# Patient Record
Sex: Male | Born: 1960
Health system: Southern US, Community
[De-identification: ages and names within clinical notes are randomized; demographics above are authoritative.]

## PROBLEM LIST (undated history)

## (undated) DIAGNOSIS — F32A Depression, unspecified: Secondary | ICD-10-CM

## (undated) DIAGNOSIS — R202 Paresthesia of skin: Secondary | ICD-10-CM

## (undated) DIAGNOSIS — F329 Major depressive disorder, single episode, unspecified: Secondary | ICD-10-CM

## (undated) DIAGNOSIS — R079 Chest pain, unspecified: Secondary | ICD-10-CM

## (undated) DIAGNOSIS — R200 Anesthesia of skin: Secondary | ICD-10-CM

## (undated) DIAGNOSIS — F4310 Post-traumatic stress disorder, unspecified: Secondary | ICD-10-CM

## (undated) DIAGNOSIS — M9979 Connective tissue and disc stenosis of intervertebral foramina of abdomen and other regions: Secondary | ICD-10-CM

## (undated) DIAGNOSIS — R001 Bradycardia, unspecified: Secondary | ICD-10-CM

## (undated) DIAGNOSIS — M1990 Unspecified osteoarthritis, unspecified site: Secondary | ICD-10-CM

## (undated) DIAGNOSIS — F419 Anxiety disorder, unspecified: Secondary | ICD-10-CM

## (undated) DIAGNOSIS — D649 Anemia, unspecified: Secondary | ICD-10-CM

## (undated) DIAGNOSIS — G629 Polyneuropathy, unspecified: Secondary | ICD-10-CM

## (undated) HISTORY — PX: WISDOM TOOTH EXTRACTION: SHX21

## (undated) HISTORY — DX: Anxiety disorder, unspecified: F41.9

## (undated) HISTORY — DX: Depression, unspecified: F32.A

## (undated) HISTORY — DX: Major depressive disorder, single episode, unspecified: F32.9

## (undated) HISTORY — PX: COLONOSCOPY: SHX5424

## (undated) HISTORY — PX: SPINE SURGERY: SHX786

---

## 1997-10-18 ENCOUNTER — Emergency Department (HOSPITAL_COMMUNITY): Admission: EM | Admit: 1997-10-18 | Discharge: 1997-10-18 | Payer: Self-pay | Admitting: Emergency Medicine

## 2000-07-27 ENCOUNTER — Encounter: Payer: Self-pay | Admitting: Emergency Medicine

## 2000-07-27 ENCOUNTER — Observation Stay (HOSPITAL_COMMUNITY): Admission: EM | Admit: 2000-07-27 | Discharge: 2000-07-28 | Payer: Self-pay | Admitting: Emergency Medicine

## 2001-05-31 ENCOUNTER — Encounter: Admission: RE | Admit: 2001-05-31 | Discharge: 2001-05-31 | Payer: Self-pay | Admitting: *Deleted

## 2001-06-01 ENCOUNTER — Encounter: Admission: RE | Admit: 2001-06-01 | Discharge: 2001-06-01 | Payer: Self-pay | Admitting: *Deleted

## 2003-02-01 ENCOUNTER — Ambulatory Visit (HOSPITAL_COMMUNITY): Admission: RE | Admit: 2003-02-01 | Discharge: 2003-02-01 | Payer: Self-pay | Admitting: Internal Medicine

## 2003-07-24 ENCOUNTER — Inpatient Hospital Stay (HOSPITAL_COMMUNITY): Admission: EM | Admit: 2003-07-24 | Discharge: 2003-07-25 | Payer: Self-pay | Admitting: Emergency Medicine

## 2003-11-29 ENCOUNTER — Other Ambulatory Visit (HOSPITAL_COMMUNITY): Admission: RE | Admit: 2003-11-29 | Discharge: 2004-02-27 | Payer: Self-pay | Admitting: Psychiatry

## 2003-11-29 ENCOUNTER — Ambulatory Visit: Payer: Self-pay | Admitting: Psychiatry

## 2003-12-14 ENCOUNTER — Ambulatory Visit (HOSPITAL_COMMUNITY): Payer: Self-pay | Admitting: Professional Counselor

## 2003-12-20 ENCOUNTER — Ambulatory Visit (HOSPITAL_COMMUNITY): Payer: Self-pay | Admitting: Professional Counselor

## 2004-09-25 ENCOUNTER — Ambulatory Visit: Payer: Self-pay | Admitting: Internal Medicine

## 2005-01-16 ENCOUNTER — Ambulatory Visit: Payer: Self-pay | Admitting: Family Medicine

## 2005-02-25 ENCOUNTER — Ambulatory Visit: Payer: Self-pay | Admitting: Internal Medicine

## 2005-04-28 ENCOUNTER — Ambulatory Visit: Payer: Self-pay | Admitting: Internal Medicine

## 2005-08-01 ENCOUNTER — Ambulatory Visit: Payer: Self-pay | Admitting: Internal Medicine

## 2005-09-29 ENCOUNTER — Ambulatory Visit: Payer: Self-pay | Admitting: Internal Medicine

## 2005-10-01 ENCOUNTER — Ambulatory Visit: Payer: Self-pay | Admitting: Internal Medicine

## 2006-03-31 ENCOUNTER — Ambulatory Visit: Payer: Self-pay | Admitting: Internal Medicine

## 2006-05-28 ENCOUNTER — Ambulatory Visit: Payer: Self-pay | Admitting: Family Medicine

## 2006-05-28 LAB — CONVERTED CEMR LAB
Basophils Relative: 0.2 % (ref 0.0–1.0)
Calcium: 9.5 mg/dL (ref 8.4–10.5)
Chloride: 103 meq/L (ref 96–112)
Eosinophils Relative: 4 % (ref 0.0–5.0)
GFR calc Af Amer: 94 mL/min
Glucose, Bld: 99 mg/dL (ref 70–99)
HCT: 45 % (ref 39.0–52.0)
Lipase: 15 units/L (ref 11.0–59.0)
Monocytes Relative: 9 % (ref 3.0–11.0)
Neutro Abs: 3 10*3/uL (ref 1.4–7.7)
Neutrophils Relative %: 62.5 % (ref 43.0–77.0)
Potassium: 4.7 meq/L (ref 3.5–5.1)
RBC: 4.29 M/uL (ref 4.22–5.81)
RDW: 12.5 % (ref 11.5–14.6)
WBC: 4.7 10*3/uL (ref 4.5–10.5)

## 2006-05-29 ENCOUNTER — Ambulatory Visit: Payer: Self-pay | Admitting: Gastroenterology

## 2006-12-31 ENCOUNTER — Ambulatory Visit: Payer: Self-pay | Admitting: Internal Medicine

## 2006-12-31 ENCOUNTER — Encounter (INDEPENDENT_AMBULATORY_CARE_PROVIDER_SITE_OTHER): Payer: Self-pay | Admitting: *Deleted

## 2006-12-31 DIAGNOSIS — F3289 Other specified depressive episodes: Secondary | ICD-10-CM | POA: Insufficient documentation

## 2006-12-31 DIAGNOSIS — F329 Major depressive disorder, single episode, unspecified: Secondary | ICD-10-CM

## 2007-01-08 ENCOUNTER — Telehealth (INDEPENDENT_AMBULATORY_CARE_PROVIDER_SITE_OTHER): Payer: Self-pay | Admitting: *Deleted

## 2007-01-26 ENCOUNTER — Ambulatory Visit: Payer: Self-pay | Admitting: Internal Medicine

## 2007-01-26 DIAGNOSIS — F411 Generalized anxiety disorder: Secondary | ICD-10-CM

## 2007-03-16 ENCOUNTER — Telehealth: Payer: Self-pay | Admitting: Internal Medicine

## 2007-06-07 ENCOUNTER — Telehealth (INDEPENDENT_AMBULATORY_CARE_PROVIDER_SITE_OTHER): Payer: Self-pay | Admitting: *Deleted

## 2007-07-23 ENCOUNTER — Telehealth (INDEPENDENT_AMBULATORY_CARE_PROVIDER_SITE_OTHER): Payer: Self-pay | Admitting: *Deleted

## 2007-07-27 ENCOUNTER — Telehealth (INDEPENDENT_AMBULATORY_CARE_PROVIDER_SITE_OTHER): Payer: Self-pay | Admitting: *Deleted

## 2007-07-29 ENCOUNTER — Ambulatory Visit: Payer: Self-pay | Admitting: Internal Medicine

## 2007-09-20 ENCOUNTER — Telehealth (INDEPENDENT_AMBULATORY_CARE_PROVIDER_SITE_OTHER): Payer: Self-pay | Admitting: *Deleted

## 2007-11-02 ENCOUNTER — Telehealth (INDEPENDENT_AMBULATORY_CARE_PROVIDER_SITE_OTHER): Payer: Self-pay | Admitting: *Deleted

## 2007-11-23 ENCOUNTER — Ambulatory Visit: Payer: Self-pay | Admitting: Internal Medicine

## 2008-01-03 ENCOUNTER — Telehealth (INDEPENDENT_AMBULATORY_CARE_PROVIDER_SITE_OTHER): Payer: Self-pay | Admitting: *Deleted

## 2008-04-14 ENCOUNTER — Ambulatory Visit: Payer: Self-pay | Admitting: Family Medicine

## 2008-04-14 LAB — CONVERTED CEMR LAB
Inflenza A Ag: NEGATIVE
Influenza B Ag: NEGATIVE

## 2008-06-20 ENCOUNTER — Telehealth (INDEPENDENT_AMBULATORY_CARE_PROVIDER_SITE_OTHER): Payer: Self-pay | Admitting: *Deleted

## 2008-06-29 ENCOUNTER — Encounter: Payer: Self-pay | Admitting: Internal Medicine

## 2008-06-29 ENCOUNTER — Ambulatory Visit: Payer: Self-pay | Admitting: Internal Medicine

## 2008-06-29 ENCOUNTER — Ambulatory Visit: Payer: Self-pay | Admitting: Surgery

## 2008-06-29 ENCOUNTER — Ambulatory Visit: Payer: Self-pay | Admitting: Diagnostic Radiology

## 2008-06-29 ENCOUNTER — Inpatient Hospital Stay (HOSPITAL_COMMUNITY): Admission: AD | Admit: 2008-06-29 | Discharge: 2008-06-30 | Payer: Self-pay | Admitting: Internal Medicine

## 2008-06-29 ENCOUNTER — Encounter: Payer: Self-pay | Admitting: Emergency Medicine

## 2008-06-29 DIAGNOSIS — F1010 Alcohol abuse, uncomplicated: Secondary | ICD-10-CM | POA: Insufficient documentation

## 2008-06-29 DIAGNOSIS — R55 Syncope and collapse: Secondary | ICD-10-CM

## 2008-06-30 ENCOUNTER — Encounter (INDEPENDENT_AMBULATORY_CARE_PROVIDER_SITE_OTHER): Payer: Self-pay | Admitting: Orthopedic Surgery

## 2009-01-29 ENCOUNTER — Telehealth (INDEPENDENT_AMBULATORY_CARE_PROVIDER_SITE_OTHER): Payer: Self-pay | Admitting: *Deleted

## 2009-01-30 ENCOUNTER — Telehealth: Payer: Self-pay | Admitting: Family Medicine

## 2009-01-30 ENCOUNTER — Encounter: Payer: Self-pay | Admitting: Internal Medicine

## 2009-01-30 ENCOUNTER — Ambulatory Visit: Payer: Self-pay | Admitting: Family Medicine

## 2009-01-30 DIAGNOSIS — R42 Dizziness and giddiness: Secondary | ICD-10-CM

## 2009-01-30 DIAGNOSIS — R079 Chest pain, unspecified: Secondary | ICD-10-CM | POA: Insufficient documentation

## 2009-01-30 LAB — CONVERTED CEMR LAB
ALT: 22 units/L (ref 0–53)
AST: 22 units/L (ref 0–37)
Alkaline Phosphatase: 53 units/L (ref 39–117)
Basophils Absolute: 0 10*3/uL (ref 0.0–0.1)
CK-MB: 0.9 ng/mL (ref 0.3–4.0)
CO2: 30 meq/L (ref 19–32)
Calcium: 9.7 mg/dL (ref 8.4–10.5)
Chloride: 100 meq/L (ref 96–112)
Creatinine, Ser: 1 mg/dL (ref 0.4–1.5)
Eosinophils Relative: 1.5 % (ref 0.0–5.0)
GFR calc non Af Amer: 85.39 mL/min (ref >60)
Glucose, Bld: 96 mg/dL (ref 70–99)
HCT: 44.4 % (ref 39.0–52.0)
Lymphocytes Relative: 27.4 % (ref 12.0–46.0)
Lymphs Abs: 1.5 10*3/uL (ref 0.7–4.0)
MCV: 102.1 fL — ABNORMAL HIGH (ref 78.0–100.0)
Monocytes Absolute: 0.4 10*3/uL (ref 0.1–1.0)
Neutro Abs: 3.3 10*3/uL (ref 1.4–7.7)
RBC: 4.35 M/uL (ref 4.22–5.81)
RDW: 12.9 % (ref 11.5–14.6)
Total Bilirubin: 0.9 mg/dL (ref 0.3–1.2)
WBC: 5.3 10*3/uL (ref 4.5–10.5)

## 2009-02-01 ENCOUNTER — Telehealth (INDEPENDENT_AMBULATORY_CARE_PROVIDER_SITE_OTHER): Payer: Self-pay | Admitting: *Deleted

## 2009-05-19 ENCOUNTER — Emergency Department (HOSPITAL_BASED_OUTPATIENT_CLINIC_OR_DEPARTMENT_OTHER): Admission: EM | Admit: 2009-05-19 | Discharge: 2009-05-19 | Payer: Self-pay | Admitting: Emergency Medicine

## 2009-05-29 ENCOUNTER — Ambulatory Visit: Payer: Self-pay | Admitting: Family Medicine

## 2009-05-29 DIAGNOSIS — R5381 Other malaise: Secondary | ICD-10-CM

## 2009-05-29 DIAGNOSIS — R5383 Other fatigue: Secondary | ICD-10-CM

## 2009-05-29 DIAGNOSIS — J029 Acute pharyngitis, unspecified: Secondary | ICD-10-CM | POA: Insufficient documentation

## 2009-05-30 ENCOUNTER — Encounter: Payer: Self-pay | Admitting: Family Medicine

## 2009-06-04 ENCOUNTER — Telehealth: Payer: Self-pay | Admitting: Family Medicine

## 2009-06-04 LAB — CONVERTED CEMR LAB
Albumin: 4.3 g/dL (ref 3.5–5.2)
Basophils Absolute: 0 10*3/uL (ref 0.0–0.1)
Basophils Relative: 0.1 % (ref 0.0–3.0)
CO2: 30 meq/L (ref 19–32)
Calcium: 9.7 mg/dL (ref 8.4–10.5)
Chloride: 107 meq/L (ref 96–112)
Creatinine, Ser: 1.1 mg/dL (ref 0.4–1.5)
EBV VCA IgM: 0.16
Eosinophils Absolute: 0.1 10*3/uL (ref 0.0–0.7)
Eosinophils Relative: 1.8 % (ref 0.0–5.0)
Folate: 10.2 ng/mL
Free T4: 0.8 ng/dL (ref 0.6–1.6)
Glucose, Bld: 97 mg/dL (ref 70–99)
HCT: 43.8 % (ref 39.0–52.0)
Lymphs Abs: 1.1 10*3/uL (ref 0.7–4.0)
MCV: 102 fL — ABNORMAL HIGH (ref 78.0–100.0)
Mono Screen: NEGATIVE
Monocytes Absolute: 0.3 10*3/uL (ref 0.1–1.0)
Neutrophils Relative %: 68 % (ref 43.0–77.0)
Vitamin B-12: 568 pg/mL (ref 211–911)
WBC: 4.6 10*3/uL (ref 4.5–10.5)

## 2009-06-08 ENCOUNTER — Encounter: Payer: Self-pay | Admitting: Family Medicine

## 2010-04-25 NOTE — Consult Note (Signed)
Summary: [facility]  [facility]   Imported By: Lanelle Bal 06/18/2009 10:38:09  _____________________________________________________________________  External Attachment:    Type:   Image     Comment:   External Document

## 2010-04-25 NOTE — Assessment & Plan Note (Signed)
Summary: PAIN IN THROAT/FATIGUE/KDC   Vital Signs:  Patient profile:   50 year old male Weight:      253 pounds Temp:     98.5 degrees F oral Pulse rate:   68 / minute Pulse rhythm:   regular BP sitting:   132 / 80  (left arm) Cuff size:   large  Vitals Entered By: Army Fossa CMA (May 29, 2009 9:49 AM) CC: Pt c/o sore throat x 4 weeks, very fatigued.  Was seen at the [facility], they did a strep and mono both came back negative.    History of Present Illness: Pt here c/o sore throat and fatigue for 4-5 weeks.  Pt was seen in ER and strep and mono then were negative.  Pt feels no better.  No fever,  no congestion.  Allergies (verified): No Known Drug Allergies  Social History: Married 1 adopted daughter 2 step sons All gone Household: pt. +wife+step son works in the same company with wife + chews tobacco since 80s   Review of Systems      See HPI  Physical Exam  General:  Well-developed,well-nourished,in no acute distress; alert,appropriate and cooperative throughout examination Ears:  External ear exam shows no significant lesions or deformities.  Otoscopic examination reveals clear canals, tympanic membranes are intact bilaterally without bulging, retraction, inflammation or discharge. Hearing is grossly normal bilaterally. Nose:  External nasal examination shows no deformity or inflammation. Nasal mucosa are pink and moist without lesions or exudates. Mouth:  no exudates and pharyngeal erythema.   Neck:  No deformities, masses, or tenderness noted. Lungs:  Normal respiratory effort, chest expands symmetrically. Lungs are clear to auscultation, no crackles or wheezes. Heart:  normal rate and no murmur.   Psych:  Oriented X3 and normally interactive.     Impression & Recommendations:  Problem # 1:  ACUTE PHARYNGITIS (ICD-462) If no better with abx and labs negative---refer to ENT---pt with long hx tob chewing His updated medication list for this problem  includes:    Ceftin 500 Mg Tabs (Cefuroxime axetil) .Marland Kitchen... 1 by mouth two times a day  Orders: T-Culture, Throat ([address]-60454) Venipuncture (09811) TLB-B12 + Folate Pnl (91478_29562-Z30/QMV) TLB-BMP (Basic Metabolic Panel-BMET) (80048-METABOL) TLB-CBC Platelet - w/Differential (85025-CBCD) TLB-Hepatic/Liver Function Pnl (80076-HEPATIC) TLB-TSH (Thyroid Stimulating Hormone) (84443-TSH) TLB-T4 (Thyrox), Free [phone]) TLB-T3, Free (Triiodothyronine) (84481-T3FREE) TLB-Mono Test (Monospot) (28413-KGMW) T- * Misc. Laboratory test [id number])  Instructed to complete antibiotics and call if not improved in 48 hours.   Problem # 2:  FATIGUE (ICD-780.79)  Orders: Venipuncture (53664) TLB-B12 + Folate Pnl (40347_42595-G38/VFI) TLB-BMP (Basic Metabolic Panel-BMET) (80048-METABOL) TLB-CBC Platelet - w/Differential (85025-CBCD) TLB-Hepatic/Liver Function Pnl (80076-HEPATIC) TLB-TSH (Thyroid Stimulating Hormone) (84443-TSH) TLB-T4 (Thyrox), Free [phone]) TLB-T3, Free (Triiodothyronine) (84481-T3FREE) TLB-Mono Test (Monospot) (41660-YTKZ) T- * Misc. Laboratory test [phone])  Complete Medication List: 1)  Neurontin 300 Mg Caps (Gabapentin) .Marland Kitchen.. 1 by mouth qid 2)  Ceftin 500 Mg Tabs (Cefuroxime axetil) .Marland Kitchen.. 1 by mouth two times a day 3)  Prednisone 10 Mg Tabs (Prednisone) .... 3 by mouth once daily for 3 days then 2 by mouth once daily for 3 days then 1 by mouth once daily for 3 days 4)  Ultram 50 Mg Tabs (Tramadol hcl) .Marland Kitchen.. 1- 2 by mouth every 6 hours as needed Prescriptions: ULTRAM 50 MG TABS (TRAMADOL HCL) 1- 2 by mouth every 6 hours as needed  #30 x 0   Entered and Authorized by:   Loreen Freud DO   Signed by:   Myrene Buddy  Lowne DO on 05/29/2009   Method used:   Electronically to        [facility] [phone]* (retail)       [address]       [place], [place]  [address]       Ph: [phone]       Fax: [phone]   RxID:   [id number] PREDNISONE 10  MG TABS (PREDNISONE) 3 by mouth once daily for 3 days then 2 by mouth once daily for 3 days then 1 by mouth once daily for 3 days  #18 x 0   Entered and Authorized by:   Loreen Freud DO   Signed by:   Loreen Freud DO on 05/29/2009   Method used:   Electronically to        [facility] [phone]* (retail)       [address]       [place], [place]  [address]       Ph: [phone]       Fax: [phone]   RxID:   [id number] CEFTIN 500 MG TABS (CEFUROXIME AXETIL) 1 by mouth two times a day  #20 x 0   Entered and Authorized by:   Loreen Freud DO   Signed by:   Loreen Freud DO on 05/29/2009   Method used:   Electronically to        [facility] [phone]* (retail)       [address]       [place], [place]  [address]       Ph: [phone]       Fax: [phone]   RxID:   [id number]

## 2010-04-25 NOTE — Progress Notes (Signed)
Summary: Refill  Phone Note Refill Request Message from:  Patient  Refills Requested: Medication #1:  ULTRAM 50 MG TABS 1- 2 by mouth every 6 hours as needed.   Last Refilled: 05/29/2009 Pt called and stated that he ran out of the Ultram, his throat is still bothering him and in a lot of pain. He states he is taking the ATB and Prednisone as prescribed. Army Fossa CMA  June 04, 2009 11:37 AM    Follow-up for Phone Call        refill ultram x1---refer to ent Follow-up by: Loreen Freud DO,  June 04, 2009 12:15 PM  Additional Follow-up for Phone Call Additional follow up Details #1::        Pt is aware. Army Fossa CMA  June 04, 2009 1:22 PM     Prescriptions: ULTRAM 50 MG TABS (TRAMADOL HCL) 1- 2 by mouth every 6 hours as needed  #30 x 0   Entered by:   Army Fossa CMA   Authorized by:   Loreen Freud DO   Signed by:   Army Fossa CMA on 06/04/2009   Method used:   Electronically to        [facility] [phone]* (retail)       [address]       [place], [place]  [address]       Ph: [phone]       Fax: [phone]   RxID:   [id number]

## 2010-06-03 ENCOUNTER — Emergency Department (INDEPENDENT_AMBULATORY_CARE_PROVIDER_SITE_OTHER): Payer: 59

## 2010-06-03 ENCOUNTER — Emergency Department (HOSPITAL_BASED_OUTPATIENT_CLINIC_OR_DEPARTMENT_OTHER)
Admission: EM | Admit: 2010-06-03 | Discharge: 2010-06-03 | Disposition: A | Discharge status: Home / Self Care | Payer: 59 | Attending: Emergency Medicine | Admitting: Emergency Medicine

## 2010-06-03 DIAGNOSIS — M25529 Pain in unspecified elbow: Secondary | ICD-10-CM

## 2010-06-03 DIAGNOSIS — M7710 Lateral epicondylitis, unspecified elbow: Secondary | ICD-10-CM | POA: Insufficient documentation

## 2010-06-12 LAB — MONONUCLEOSIS SCREEN: Mono Screen: NEGATIVE

## 2010-07-03 LAB — POCT TOXICOLOGY PANEL: Benzodiazepines: POSITIVE

## 2010-07-03 LAB — DIFFERENTIAL
Basophils Absolute: 0.1 10*3/uL (ref 0.0–0.1)
Basophils Relative: 2 % — ABNORMAL HIGH (ref 0–1)
Eosinophils Absolute: 0.2 10*3/uL (ref 0.0–0.7)
Lymphocytes Relative: 25 % (ref 12–46)
Lymphs Abs: 1.1 10*3/uL (ref 0.7–4.0)
Neutro Abs: 2.8 10*3/uL (ref 1.7–7.7)
Neutrophils Relative %: 61 % (ref 43–77)

## 2010-07-03 LAB — URINALYSIS, ROUTINE W REFLEX MICROSCOPIC
Nitrite: NEGATIVE
Protein, ur: NEGATIVE mg/dL

## 2010-07-03 LAB — COMPREHENSIVE METABOLIC PANEL
ALT: 17 U/L (ref 0–53)
Albumin: 4.4 g/dL (ref 3.5–5.2)
Chloride: 105 mEq/L (ref 96–112)
GFR calc Af Amer: >60 mL/min (ref >60)
GFR calc non Af Amer: >60 mL/min (ref >60)
Potassium: 4.8 mEq/L (ref 3.5–5.1)
Sodium: 141 mEq/L (ref 135–145)
Total Protein: 8 g/dL (ref 6.0–8.3)

## 2010-07-03 LAB — CBC
Hemoglobin: 16 g/dL (ref 13.0–17.0)
Platelets: 267 10*3/uL (ref 150–400)
WBC: 4.6 10*3/uL (ref 4.0–10.5)

## 2010-07-03 LAB — CARDIAC PANEL(CRET KIN+CKTOT+MB+TROPI)
CK, MB: 0.6 ng/mL (ref 0.3–4.0)
CK, MB: 0.6 ng/mL (ref 0.3–4.0)
Relative Index: INVALID (ref 0.0–2.5)
Relative Index: INVALID (ref 0.0–2.5)
Total CK: 50 U/L (ref 7–232)
Total CK: 52 U/L (ref 7–232)
Troponin I: <0.01 ng/mL (ref 0.00–0.06)
Troponin I: <0.01 ng/mL (ref 0.00–0.06)

## 2010-07-03 LAB — ETHANOL: Alcohol, Ethyl (B): <10 mg/dL (ref 0–10)

## 2010-07-03 LAB — POCT CARDIAC MARKERS
CKMB, poc: <1 ng/mL — ABNORMAL LOW (ref 1.0–8.0)
Myoglobin, poc: 90.8 ng/mL (ref 12–200)
Troponin i, poc: <0.05 ng/mL (ref 0.00–0.09)

## 2010-07-03 LAB — MAGNESIUM: Magnesium: 2.3 mg/dL (ref 1.5–2.5)

## 2010-08-06 NOTE — Discharge Summary (Signed)
Erik Olson, Erik Olson               ACCOUNT NO.:  [id number]   MEDICAL RECORD NO.:  [id number]          PATIENT TYPE:  OBV   LOCATION:  4729                         FACILITY:  [facility]   PHYSICIAN:  Valerie A. Felicity Coyer, MDDATE OF BIRTH:  December 09, 1960   DATE OF ADMISSION:  06/29/2008  DATE OF DISCHARGE:  06/30/2008                               DISCHARGE SUMMARY   DISCHARGE DIAGNOSES:  1. Near syncope, question related to Xanax use.  No acute cardiac      abnormalities.  2. Chronic bradycardia, not clearly symptomatic.  3. History of anxiety and depression.  Outpatient followup with      Psychiatry as ongoing prior to admission for further med      management.   DISCHARGE MEDICATIONS:  Xanax 0.5 mg tablets, the patient instructed to  take one-half tablet p.o. at bedtime and q.6 h. p.r.n. anxiety symptoms  rather than whole tablet.   Hospital followup will be a psychiatrist, Dr. Bobbe Medico as  previously scheduled for next Tuesday, July 04, 2008, for continuing  ongoing management and med adjustment of his depression and anxiety  symptoms in place of Xanax with intention for eventual discontinuation  of benzodiazepines altogether.  The patient is also instructed to follow  up with his primary care physician, Dr. Willow Ora for followup on other  medical issues in the next two weeks.   CONDITION ON DISCHARGE:  Asymptomatic, medically improved and stable.   HOSPITAL COURSE:  Near syncope.  The patient is a 50 year old physically  active gentleman with chronic bradycardia as evidenced on prior EKGs who  presented to the [facility] ER due to near-syncopal events.  He was found to have bradycardia and concern for possible symptomatic  bradycardia prompted referral for admission.  He was brought to [facility] where he was monitored on telemetry and found to have heart rates  in the 40s and 30s while sleeping and at rest, but not clearly  associated with any recurrence of  dizziness.  The patient relates he has  been taking increasing doses of his Xanax tablets for anxiety and  depression symptoms and now recalls that these symptoms have increased  with his increasing doses of Xanax and question of the meds dosing and  symptoms are related as he was not clearly symptomatic with bradycardic  episodes itself.  Cardiac enzymes were negative x3, and the patient had  a normal CBC and complete metabolic panel.  It was felt that his  symptoms were likely noncardiogenic.  A 2-D echo has been performed, but  results pending at time of dictation.  Assuming these results will be  negative, the patient is anticipated for discharge home later today with  instructions to reduce his use of Xanax to one-half tablet at bedtime  and every 6 hours as needed for anxiety, but also instructed to not  discontinue this altogether due to chronicity of use and possible  withdrawal symptoms until further followup with his psychiatrist next  week.  These plans are reviewed with the patient and his wife who  understand.  Having no further  inpatient needs, he is felt stable for  discharge home, again provided that the echocardiogram is without  abnormalities of significance.     Valerie A. Felicity Coyer, MD  Electronically Signed    VAL/MEDQ  D:  06/30/2008  T:  07/01/2008  Job:  [id number]

## 2010-08-09 NOTE — Discharge Summary (Signed)
NAME:  Erik Olson, Erik Olson                         ACCOUNT NO.:  [id number]   MEDICAL RECORD NO.:  [id number]                   PATIENT TYPE:  INP   LOCATION:  2030                                 FACILITY:  [facility]   PHYSICIAN:  Thereasa Solo. Little, M.D.              DATE OF BIRTH:  01/29/1961   DATE OF ADMISSION:  07/24/2003  DATE OF DISCHARGE:  07/25/2003                                 DISCHARGE SUMMARY   ADDENDUM:  The results of his MR of his brain were negative for any pathology, however,  his neck films did show a possible bulging disc at C5 and C6 impinging on  his cord.  This was reviewed by Dr. Anne Hahn.  This is a verbal report given  by him to me.  He states that it was a poor quality study.  The patient is  still having symptoms of left arm numbness.  He suggests putting him on  prednisone 10 mg 12-day protocol Dosepak.  He is to be seen back in the  office by Dr. Vickey Huger in two weeks.  If the symptoms are not improved at  that time, he may need surgery.  This was told to the patient.  He was also  told to do no lifting at all, no strenuous activity.  I told him he could  walk on his treadmill but not strenuous.  He states this helps with his  stress level.  He is not to return to work until Friday.  He is to see Dr.  Clarene Duke for groin check on Thursday at 2 p.m.  He was given a prescription  for his prednisone Dosepak.  Also to note, he has no central nervous system  complaints such as gait disorder or bladder dysfunction.      Lezlie Octave, N.P.                        Thereasa Solo. Little, M.D.    BB/MEDQ  D:  07/25/2003  T:  07/26/2003  Job:  [id number]   cc:   Angelena Sole, M.D. [facility]   Salena Saner Lesia Sago, M.D.  [address]  [address]  [place]  [place] [address]  Fax: [phone]

## 2010-08-09 NOTE — Consult Note (Signed)
NAME:  Erik Olson, Erik Olson                         ACCOUNT NO.:  [id number]   MEDICAL RECORD NO.:  [id number]                   PATIENT TYPE:  INP   LOCATION:  2030                                 FACILITY:  [facility]   PHYSICIAN:  Melvyn Novas, M.D.               DATE OF BIRTH:  1960-07-04   DATE OF CONSULTATION:  07/24/2003  DATE OF DISCHARGE:                                   CONSULTATION   HISTORY OF PRESENT ILLNESS:  This patient presented to the ER today by EMS  being brought in from his work place.  He is a 50 year old right-handed  Caucasian gentleman with no significant past medical history except for diet-  controlled irritable bowel syndrome.  He suffered an acute onset of chest  tightness and pressure, shortness of breath, and then noticed left arm  weakness and numbness involving the left arm, weakness and numbness  involving the whole left upper ex from the axilla downwards.  He has not  noticed any cranial nerve involvement, lower extremity involvement, or  truncal involvement.  Since this patient's symptoms occurred acutely during  his office workday, he was not aware of having had any physical activity or  other triggers, and he was not under excessive stress, he states.   PAST MEDICAL HISTORY:  Irritable bowel syndrome.   MEDICATIONS:  He takes occasional aspirin for headaches, not regular.   ALLERGIES:  PENICILLIN.   SOCIAL HISTORY:  The patient is married.  He works in an office job.  He  denies smoking or alcohol use.  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  He denies steroid use.   FAMILY HISTORY:  Both parents were healthy.  His father suffers from mild  hypertension.   PHYSICAL EXAMINATION:  Vital signs:  Blood pressure currently 123/76.  Upon  admission at 10 in the morning, he had 134 systolic/83 diastolic.  Heart  rate remains in the 50s and 60s and regular.  The patient has no tachypnea.  Respiratory rate is 18, temperature is  98 degrees Fahrenheit.  HEENT:  Mucous membranes are well perfused.  He is not diaphoretic.  He  appears not extremely anxious, nervous, or restless.  NEUROLOGIC:  Mental status alert and oriented x 3.  No dysarthria, aphasia,  apraxia, or vision impairment.  No memory loss.  Cranial nerves:  Pupils  react, equal to light and accommodation.  Full visual field to bilateral  simultaneous stimulation, full extraocular movements, no papilledema, no gag  delay.  Facial symmetry preserved for motor and sensory.   Motor exam shows left-sided pronator drift, left-sided grip deficit with  pinch strength loss in the left hand, as well.  Sensory loss is less well  defined and seems to begin at the mid biceps level downwards in a glove  distribution, impairs primary modalities over the palm as well as over the  dorsum of the hand.  All five fingers are involved, tingling in all five  fingertips is noted.  It does not involve, however, chest, shoulder, neck,  or face.  Deep tendon reflexes were decreased in the left upper extremity.  No Babinski response was elicited on either side for his lower extremities  which show full strength, 5/5 bilaterally, with tone and mass equal.  Coordination shows mild dysmetria on the left with finger-nose test, no  ataxia or tremor.   ASSESSMENT:  1. Question of central event for sensory loss.  This would be an unusual     distribution for a central nervous system event given that a thalamic     lesion would involve his whole left side and would probably not spare his     cranial nerves, either.  The patient's sensory and motor loss being     restricted to the left upper extremity alone is most likely due to either     a cervical spine or brachioplexus abnormality.  I suggest to obtain an     MRI of the cervical spine and, for completion, do a one-time MRI of the     brain, as well, if the cervical spine is normal.  EMG and nerve     conduction studies at this point  are too early and would not show signs     of electrophysiologic abnormality.  For this, the patient would have to     have symptoms for 14 days plus.  I would therefore like to schedule those     as outpatient at [facility] with your permission to     see Dr.  Kelli Hope, our neuromuscular specialist.  2. I agree with the cardiac orders and feel that it is safe to keep the     patient's blood pressure low.  Anticoagulation orders were written by Dr.     Julieanne Manson to which this patient's primary care will be directed     during hospital stay.   I appreciate your consult.                                               Melvyn Novas, M.D.    CD/MEDQ  D:  07/24/2003  T:  07/25/2003  Job:  [id number]   cc:   Thereasa Solo. Little, M.D.  [address]  [address]  [place]  [place] [address]  Fax: [phone]   [facility]

## 2010-08-09 NOTE — Discharge Summary (Signed)
NAME:  Erik Olson, Erik Olson                         ACCOUNT NO.:  [id number]   MEDICAL RECORD NO.:  [id number]                   PATIENT TYPE:  INP   LOCATION:  2030                                 FACILITY:  [facility]   PHYSICIAN:  Thereasa Solo. Little, M.D.              DATE OF BIRTH:  12-08-1960   DATE OF ADMISSION:  07/24/2003  DATE OF DISCHARGE:  07/25/2003                                 DISCHARGE SUMMARY   HOSPITAL COURSE:  Erik Olson is a 50 year old right-handed male who came to  the ER with complaints of acute onset of chest tightness and pressure,  shortness of breath and then left arm weakness and numbness involving the  left arm from the axilla downwards.  He was admitted for rule out MI.  His  CK-MBs came back negative.  He was put on IV heparin and IV nitroglycerin.  He underwent cardiac catheterization on Jul 25, 2003, which showed normal  coronary arteries.  He did have AngioSeal post cath.  He was seen in  consultation by Dr. Porfirio Mylar Dohmeier secondary to the extent of his arm  weakness.  He was noted on neuro-exam to left-sided pronator drift, left-  sided grip deficit with pinch strength loss in the left hand as well as  sensory loss which was less well-defined, began at the mid-biceps and was  downward, thus, he first had a CT of his brain which showed no evidence of  stroke, etc.  Neurology ordered him an MRI of his brain, with and without  contrast, and MRI of his neck and spine; these were pending at the time of  this dictation.  We will not send him home on any medications unless  neurology thinks he needs to be on these.  He should not sit in a tub or  water or take a bath for 5 days -- he may take a shower -- because of his  AngioSeal.  He should do no strenuous activity, lifting, pushing or pulling  until seen by Dr. Gaspar Garbe B. Little; he has an appointment to see him for  followup of his groin on Jul 27, 2003 at 2 p.m.  He should do no driving  until tomorrow afternoon.  He  will also need followup with neurology as per  their recommendations.  At this time of the dictation, neurology has not  seen him yet on Jul 25, 2003; we are awaiting his results of his MR tests.   His labs showed a homocysteine of 9.25, hemoglobin A1c 5.5, a sed rate of 6.  His cholesterol was also pending at the time of this dictation.  His other  labs show a sodium of 137, potassium 3.8, BUN of 11, creatinine 0.9.  CK-MBs  were all negative x3 and troponins were all negative.  He did have a TSH  ordered also and it was 1.046.   DISCHARGE DIAGNOSES:  1. Chest pain, not ischemic  etiology.  2. Left arm weakness along with chest pain with left arm motor weakness and     sensory loss.  MRI with and without contrast of the brain has been     ordered and the results are pending, and also cervical neck films are     pending.  Neurology has suggested if these were negative, that in 14 days     he may need an electromyogram and nerve     conduction studies; this will be performed by Dr. Casimiro Needle L. Reynolds,     who is the Advertising account executive.  His discharge is pending at the     time of this dictation.  3. Cholesterol unknown; laboratories are pending at this time.      Lezlie Octave, N.P.                        Thereasa Solo. Little, M.D.    BB/MEDQ  D:  07/25/2003  T:  07/26/2003  Job:  [id number]   cc:   Melvyn Novas, M.D.  [address]  [address]  [place]  [place] [address]  Fax: [phone]   Marolyn Hammock. Thad Ranger, M.D.  [address]  [address]  [place]  [place] [address]  Fax: [phone]

## 2010-08-09 NOTE — Cardiovascular Report (Signed)
NAME:  Erik Olson, Erik Olson                         ACCOUNT NO.:  [id number]   MEDICAL RECORD NO.:  [id number]                   PATIENT TYPE:  INP   LOCATION:  2030                                 FACILITY:  [facility]   PHYSICIAN:  Madaline Savage, M.D.             DATE OF BIRTH:  Apr 04, 1960   DATE OF PROCEDURE:  07/24/2003  DATE OF DISCHARGE:  07/25/2003                              CARDIAC CATHETERIZATION   PROCEDURES PERFORMED:  1. Selective coronary angiography via Judkins technique.  2. Retrograde left heart catheterization.  3. Left ventricular angiography.  4. Successful Angio-Seal right femoral arteriotomy.   COMPLICATIONS:  None.   ENTRY SITE:  Right femoral.   DYE USED:  Omnipaque.   PATIENT PROFILE:  The patient is a very pleasant 50 year old white gentleman  who is a patient of Dr. Caprice Kluver admitted for chest pain and was found to  have right bundle branch block on his EKG.  The clinical description of the  pain was anginal, so this cardiac catheterization was deemed necessary.  The  patient's cardiac enzymes have been negative.   RESULTS:   PRESSURES:  Left ventricular pressure was 95/4, end-diastolic pressure 8.  Central aortic pressure 97/67, mean of 83.  No aortic valve gradient by  pullback technique.   ANGIOGRAPHIC RESULTS:  1. The left main coronary artery was medium in length, large in     circumference and normal.  2. The LAD courses the cardiac apex and gave rise to one medium size     diagonal branch, both LAD and diagonal branches were normal.  3. The circumflex coronary artery was normal throughout including a     trifurcating first obtuse marginal branch and a tiny intermediate ramus     branch.  No lesions were seen.  4. The RCA was dominant vessel of circulation containing no evidence of any     significant stenoses.   LV angiography showed normal contractility with some ventricular  irritability, but no wall motion abnormalities and no evidence of  any  significant mitral regurgitation.   A 45-degree RAO projection right femoral arteriogram was performed by hand  injection and showed placement of the 6 French arterial catheter well above  the bifurcation.  We therefore proceeded with successful Angio-Seal.   FINAL DIAGNOSES:  1. Angiographic patent coronary arteries with right coronary dominant     system.  2. Normal left ventricular systolic function.  3. Successful right percutaneous femoral Angio-Seal.                                               Madaline Savage, M.D.    WHG/MEDQ  D:  07/25/2003  T:  07/26/2003  Job:  [id number]

## 2010-09-03 ENCOUNTER — Ambulatory Visit (INDEPENDENT_AMBULATORY_CARE_PROVIDER_SITE_OTHER): Payer: 59 | Admitting: Family Medicine

## 2010-09-03 ENCOUNTER — Encounter: Payer: Self-pay | Admitting: Family Medicine

## 2010-09-03 VITALS — BP 120/80 | HR 70 | Temp 99.3°F | Wt 255.0 lb

## 2010-09-03 DIAGNOSIS — M545 Low back pain: Secondary | ICD-10-CM

## 2010-09-03 DIAGNOSIS — K529 Noninfective gastroenteritis and colitis, unspecified: Secondary | ICD-10-CM

## 2010-09-03 DIAGNOSIS — K5289 Other specified noninfective gastroenteritis and colitis: Secondary | ICD-10-CM

## 2010-09-03 LAB — POCT URINALYSIS DIPSTICK
Glucose, UA: NEGATIVE
Ketones, UA: NEGATIVE
Leukocytes, UA: NEGATIVE
Spec Grav, UA: 1.005
Urobilinogen, UA: 0.2

## 2010-09-03 MED ORDER — PROMETHAZINE HCL 25 MG PO TABS: 25.0000 mg | ORAL_TABLET | Freq: Four times a day (QID) | ORAL | Status: AC | PRN

## 2010-09-03 NOTE — Patient Instructions (Signed)
Gastroenteritis (Vomiting, Diarrhea, and/or Dehydration) Viral gastroenteritis is also known as stomach flu. This condition affects the stomach and intestinal tract. The illness typically lasts 3 to 8 days. Most people develop an immune response. This eventually gets rid of the virus. While this natural response develops, the virus can make you quite ill. The majority of those affected are young infants. CAUSES Diarrhea and vomiting are often caused by a virus. Medicines (antibiotics) that kill germs will not help unless there is also a germ (bacterial) infection. SYMPTOMS The most common symptom is diarrhea. This can cause severe loss of fluids (dehydration) and body salt (electrolyte) imbalance. TREATMENT Treatments for this illness are aimed at rehydration. Antidiarrheal medicines are not recommended. They do not decrease diarrhea volume and may be harmful. Usually, home treatment is all that is needed. The most serious cases involve vomiting so severely that you are not able to keep down fluids taken by mouth (orally). In these cases, intravenous (IV) fluids are needed. Vomiting with viral gastroenteritis is common, but it will usually go away with treatment. HOME CARE INSTRUCTIONS Small amounts of fluids should be taken frequently. Large amounts at one time may not be tolerated. Plain water may be harmful in infants and the elderly. Oral rehydration solutions (ORS) are available at pharmacies and grocery stores. ORS replace water and important electrolytes in proper proportions. Sports drinks are not as effective as ORS and may be harmful due to sugars worsening diarrhea.  As a general guideline for children, replace any new fluid losses from diarrhea and/or vomiting with ORS as follows:   If your child weighs 22 pounds or under (10 kg or less), give 60-120 mL (1/4 - 1/2 cup or 2 - 4 ounces) of ORS for each diarrheal stool or vomiting episode.   If your child weighs more than 22 pounds (more  than 10 kgs), give 120-240 mL (1/2 - 1 cup or 4 - 8 ounces) of ORS for each diarrheal stool or vomiting episode.   In a child with vomiting, it may be helpful to give the above ORS replacement in 5 mL (1 teaspoon) amounts every 5 minutes, then increase as tolerated.   While correcting for dehydration, children should eat normally. However, foods high in sugar should be avoided because this may worsen diarrhea. Large amounts of carbonated soft drinks, juice, gelatin desserts, and other highly sugared drinks should be avoided.   After correction of dehydration, other liquids that are appealing to the child may be added. Children should drink small amounts of fluids frequently and fluids should be increased as tolerated.   Adults should eat normally while drinking more fluids than usual. Drink small amounts of fluids frequently and increase as tolerated. Drink enough water and fluids to keep your urine clear or pale yellow. Broths, weak decaffeinated tea, lemon-lime soft drinks (allowed to go flat), and ORS replace fluids and electrolytes.  Avoid:  Carbonated drinks.  Juice.   Extremely hot or cold fluids.   Caffeine drinks.   Fatty, greasy foods.   Alcohol.  Tobacco.   Too much intake of anything at one time.   Gelatin desserts.    Probiotics are active cultures of beneficial bacteria. They may lessen the amount and number of diarrheal stools in adults. Probiotics can be found in yogurt with active cultures and in supplements.   Wash your hands well to avoid spreading bacteria and viruses.   Antidiarrheal medicines are not recommended for infants and children.   Only take over-the-counter or   prescription medicines for pain, discomfort, or fever as directed by your caregiver. Do not give aspirin to children.   For adults with dehydration, ask your caregiver if you should continue all prescribed and over-the-counter medicines.   If your caregiver has given you a follow-up  appointment, it is very important to keep that appointment. Not keeping the appointment could result in a lasting (chronic) or permanent injury and disability. If there is any problem keeping the appointment, you must call to reschedule.  SEEK IMMEDIATE MEDICAL CARE IF:  You are unable to keep fluids down.   There is no urine output in 6 to 8 hours or there is only a small amount of very dark urine.   You develop shortness of breath.   There is blood in the vomit (may look like coffee grounds) or stool.   Belly (abdominal) pain develops, increases, or localizes.   There is persistent vomiting or diarrhea.   You or your child has an oral temperature above 100.4, not controlled by medicine.   Your baby is older than 3 months with a rectal temperature of 102 F (38.9 C) or higher.   Your baby is 3 months old or younger with a rectal temperature of 100.4 F (38 C) or higher.  MAKE SURE YOU:  Understand these instructions.   Will watch your condition.   Will get help right away if you are not doing well or get worse.  Document Released: 03/10/2005 Document Re-Released: 08/28/2009 [employer] Patient Information 2011 [employer]. 

## 2010-09-03 NOTE — Assessment & Plan Note (Signed)
BRAT diet Clear fluids--advance as tolerated Phenergan prn To ER if symptoms persist

## 2010-09-03 NOTE — Progress Notes (Signed)
  Subjective:    Patient ID: Erik Olson, male    DOB: July 21, 1960, 50 y.o.   MRN: [id number]  HPI Pt here c/o NVD for 2 days --pt woke up yesterday am with NVD.  Only nausea now.  Last episode of vomiting and diarrhea was this am.  Pt tried dayquil and nyquil with no relief.   Pt had chills and felt like he was burning up--did n't check temp.  Pt is able to hold down fluids now but has not tried food since vomiting this am.      Review of Systems    as above Objective:   Physical Exam  Constitutional: He is oriented to person, place, and time. He appears well-developed.  HENT:  Head: Normocephalic.  Mouth/Throat: Oropharynx is clear and moist. No oropharyngeal exudate.  Abdominal: Soft. Bowel sounds are normal. He exhibits no distension and no mass. There is no tenderness. There is no rebound and no guarding.  Musculoskeletal: He exhibits no edema and no tenderness.  Neurological: He is alert and oriented to person, place, and time.  Skin: Skin is warm and dry.  Psychiatric: He has a normal mood and affect. His behavior is normal. Judgment and thought content normal.          Assessment & Plan:

## 2012-04-11 ENCOUNTER — Emergency Department (HOSPITAL_BASED_OUTPATIENT_CLINIC_OR_DEPARTMENT_OTHER)
Admission: EM | Admit: 2012-04-11 | Discharge: 2012-04-11 | Disposition: A | Discharge status: Home / Self Care | Payer: 59 | Attending: Emergency Medicine | Admitting: Emergency Medicine

## 2012-04-11 ENCOUNTER — Encounter (HOSPITAL_BASED_OUTPATIENT_CLINIC_OR_DEPARTMENT_OTHER): Payer: Self-pay | Admitting: *Deleted

## 2012-04-11 DIAGNOSIS — R059 Cough, unspecified: Secondary | ICD-10-CM | POA: Insufficient documentation

## 2012-04-11 DIAGNOSIS — F411 Generalized anxiety disorder: Secondary | ICD-10-CM | POA: Insufficient documentation

## 2012-04-11 DIAGNOSIS — Z79899 Other long term (current) drug therapy: Secondary | ICD-10-CM | POA: Insufficient documentation

## 2012-04-11 DIAGNOSIS — R5383 Other fatigue: Secondary | ICD-10-CM | POA: Insufficient documentation

## 2012-04-11 DIAGNOSIS — IMO0001 Reserved for inherently not codable concepts without codable children: Secondary | ICD-10-CM | POA: Insufficient documentation

## 2012-04-11 DIAGNOSIS — F329 Major depressive disorder, single episode, unspecified: Secondary | ICD-10-CM | POA: Insufficient documentation

## 2012-04-11 DIAGNOSIS — R05 Cough: Secondary | ICD-10-CM | POA: Insufficient documentation

## 2012-04-11 DIAGNOSIS — Z791 Long term (current) use of non-steroidal anti-inflammatories (NSAID): Secondary | ICD-10-CM | POA: Insufficient documentation

## 2012-04-11 DIAGNOSIS — R5381 Other malaise: Secondary | ICD-10-CM | POA: Insufficient documentation

## 2012-04-11 DIAGNOSIS — F3289 Other specified depressive episodes: Secondary | ICD-10-CM | POA: Insufficient documentation

## 2012-04-11 MED ORDER — OSELTAMIVIR PHOSPHATE 75 MG PO CAPS: 75.0000 mg | ORAL_CAPSULE | Freq: Two times a day (BID) | ORAL | Status: DC

## 2012-04-11 MED ORDER — IBUPROFEN 600 MG PO TABS: 600.0000 mg | ORAL_TABLET | Freq: Four times a day (QID) | ORAL | Status: DC | PRN

## 2012-04-11 NOTE — ED Provider Notes (Signed)
History     CSN: [id number]  Arrival date & time 04/11/12  1478   First MD Initiated Contact with Patient 04/11/12 [phone]      Chief Complaint  Patient presents with  . Influenza    (Consider location/radiation/quality/duration/timing/severity/associated sxs/prior treatment) HPI Pt with 2 days of myalgias, fever, chills, mild non-productive cough and fatigue. + sick contacts with influenza-like illness. No HA, neck stiffness, sore throat, SOB, CP, abd pain, N/V/D.  Past Medical History  Diagnosis Date  . Anxiety   . Depression     History reviewed. No pertinent past surgical history.  No family history on file.  History  Substance Use Topics  . Smoking status: Never Smoker   . Smokeless tobacco: Not on file  . Alcohol Use: No      Review of Systems  Constitutional: Positive for fever, chills and fatigue.  HENT: Negative for sore throat, neck pain and neck stiffness.   Respiratory: Positive for cough.   Gastrointestinal: Negative for nausea, vomiting, abdominal pain and diarrhea.  Musculoskeletal: Positive for myalgias. Negative for arthralgias.  Skin: Negative for rash and wound.  Neurological: Negative for numbness and headaches.    Allergies  Penicillins  Home Medications   Current Outpatient Rx  Name  Route  Sig  Dispense  Refill  . ALPRAZOLAM 0.5 MG PO TABS   Oral   Take 0.5 mg by mouth every 4 (four) hours.           Marland Kitchen GABAPENTIN 300 MG PO CAPS   Oral   Take 300 mg by mouth 4 (four) times daily.           . IBUPROFEN 600 MG PO TABS   Oral   Take 1 tablet (600 mg total) by mouth every 6 (six) hours as needed for pain.   30 tablet   0   . OSELTAMIVIR PHOSPHATE 75 MG PO CAPS   Oral   Take 1 capsule (75 mg total) by mouth every 12 (twelve) hours.   10 capsule   0     BP 134/80  Pulse 73  Temp 99.3 F (37.4 C) (Oral)  Resp 19  SpO2 98%  Physical Exam  Nursing note and vitals reviewed. Constitutional: He is oriented to person,  place, and time. He appears well-developed and well-nourished. No distress.  HENT:  Head: Normocephalic and atraumatic.  Mouth/Throat: Oropharynx is clear and moist. No oropharyngeal exudate.  Eyes: EOM are normal. Pupils are equal, round, and reactive to light.  Neck: Normal range of motion. Neck supple.       No meningismus   Cardiovascular: Normal rate and regular rhythm.   Pulmonary/Chest: Effort normal and breath sounds normal. No respiratory distress. He has no wheezes. He has no rales. He exhibits no tenderness.  Abdominal: Soft. Bowel sounds are normal. He exhibits no distension and no mass. There is no tenderness. There is no rebound and no guarding.  Musculoskeletal: Normal range of motion. He exhibits no edema and no tenderness.       No calf tenderness or swelling  Neurological: He is alert and oriented to person, place, and time.       No focal neuro deficits  Skin: Skin is warm and dry. No rash noted. No erythema.  Psychiatric: He has a normal mood and affect. His behavior is normal.    ED Course  Procedures (including critical care time)  Labs Reviewed - No data to display No results found.   1. Influenza-like illness  MDM          Loren Racer, MD 04/11/12 [phone]

## 2012-04-11 NOTE — ED Notes (Signed)
Patient states that he has been running a fever/body aches/congestion since Friday night, drinking gator aide & taking alka seltzer plus. 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

## 2012-06-07 ENCOUNTER — Emergency Department (HOSPITAL_BASED_OUTPATIENT_CLINIC_OR_DEPARTMENT_OTHER): Payer: 59

## 2012-06-07 ENCOUNTER — Emergency Department (HOSPITAL_BASED_OUTPATIENT_CLINIC_OR_DEPARTMENT_OTHER)
Admission: EM | Admit: 2012-06-07 | Discharge: 2012-06-07 | Disposition: A | Discharge status: Home / Self Care | Payer: 59 | Attending: Emergency Medicine | Admitting: Emergency Medicine

## 2012-06-07 ENCOUNTER — Encounter (HOSPITAL_BASED_OUTPATIENT_CLINIC_OR_DEPARTMENT_OTHER): Payer: Self-pay | Admitting: Emergency Medicine

## 2012-06-07 DIAGNOSIS — Y9389 Activity, other specified: Secondary | ICD-10-CM | POA: Insufficient documentation

## 2012-06-07 DIAGNOSIS — F411 Generalized anxiety disorder: Secondary | ICD-10-CM | POA: Insufficient documentation

## 2012-06-07 DIAGNOSIS — S46909A Unspecified injury of unspecified muscle, fascia and tendon at shoulder and upper arm level, unspecified arm, initial encounter: Secondary | ICD-10-CM | POA: Insufficient documentation

## 2012-06-07 DIAGNOSIS — S4992XA Unspecified injury of left shoulder and upper arm, initial encounter: Secondary | ICD-10-CM

## 2012-06-07 DIAGNOSIS — Y929 Unspecified place or not applicable: Secondary | ICD-10-CM | POA: Insufficient documentation

## 2012-06-07 DIAGNOSIS — Z79899 Other long term (current) drug therapy: Secondary | ICD-10-CM | POA: Insufficient documentation

## 2012-06-07 DIAGNOSIS — F3289 Other specified depressive episodes: Secondary | ICD-10-CM | POA: Insufficient documentation

## 2012-06-07 DIAGNOSIS — X500XXA Overexertion from strenuous movement or load, initial encounter: Secondary | ICD-10-CM | POA: Insufficient documentation

## 2012-06-07 DIAGNOSIS — S4980XA Other specified injuries of shoulder and upper arm, unspecified arm, initial encounter: Secondary | ICD-10-CM | POA: Insufficient documentation

## 2012-06-07 MED ORDER — TRAMADOL HCL 50 MG PO TABS: 50.0000 mg | ORAL_TABLET | Freq: Four times a day (QID) | ORAL | Status: DC | PRN

## 2012-06-07 MED ORDER — CYCLOBENZAPRINE HCL 10 MG PO TABS: 10.0000 mg | ORAL_TABLET | Freq: Two times a day (BID) | ORAL | Status: DC | PRN

## 2012-06-07 MED ORDER — KETOROLAC TROMETHAMINE 60 MG/2ML IM SOLN
60.0000 mg | Freq: Once | INTRAMUSCULAR | Status: AC
  Administered 2012-06-07: 60 mg via INTRAMUSCULAR
  Filled 2012-06-07: qty 2

## 2012-06-07 MED ORDER — CYCLOBENZAPRINE HCL 10 MG PO TABS
10.0000 mg | ORAL_TABLET | Freq: Once | ORAL | Status: AC
  Administered 2012-06-07: 10 mg via ORAL
  Filled 2012-06-07: qty 1

## 2012-06-07 NOTE — ED Notes (Signed)
Pt states that he is having left shoulder pain due to weight lifting, which occurred last week. Pt states that he noticed a "clicking" sound when he moves the joint. Today, the pt was lifting weights and states that he heard a crack and it "brought [him] to tears".

## 2012-06-07 NOTE — ED Provider Notes (Signed)
Medical screening examination/treatment/procedure(s) were performed by non-physician practitioner and as supervising physician I was immediately available for consultation/collaboration.   Gwyneth Sprout, MD 06/07/12 [phone]

## 2012-06-07 NOTE — ED Provider Notes (Signed)
History     CSN: [id number]  Arrival date & time 06/07/12  1113   First MD Initiated Contact with Patient 06/07/12 1215      Chief Complaint  Patient presents with  . Shoulder Pain    (Consider location/radiation/quality/duration/timing/severity/associated sxs/prior treatment) HPI Comments: Patient is a 52 year old male who presents with left shoulder pain since 1 hour ago. Patient reports the pain started suddenly and remained constant since the onset. Patient reports having occasional shoulder pain from lifting weights in the left shoulder but today he was lifting weights and heard a "crack" and had sudden onset of throbbing and severe shoulder pain. Movement and palpation make the pain worse. Nothing makes the pain better. Patient has not tried anything for symptom relief. No associated symptoms.    Past Medical History  Diagnosis Date  . Anxiety   . Depression     History reviewed. No pertinent past surgical history.  History reviewed. No pertinent family history.  History  Substance Use Topics  . Smoking status: Never Smoker   . Smokeless tobacco: Current User    Types: Snuff  . Alcohol Use: No      Review of Systems  Musculoskeletal: Positive for arthralgias.  All other systems reviewed and are negative.    Allergies  Penicillins  Home Medications   Current Outpatient Rx  Name  Route  Sig  Dispense  Refill  . ALPRAZolam (XANAX) 0.5 MG tablet   Oral   Take 0.5 mg by mouth every 4 (four) hours.           . gabapentin (NEURONTIN) 300 MG capsule   Oral   Take 300 mg by mouth 4 (four) times daily.           Marland Kitchen ibuprofen (ADVIL,MOTRIN) 600 MG tablet   Oral   Take 1 tablet (600 mg total) by mouth every 6 (six) hours as needed for pain.   30 tablet   0   . oseltamivir (TAMIFLU) 75 MG capsule   Oral   Take 1 capsule (75 mg total) by mouth every 12 (twelve) hours.   10 capsule   0     BP 155/77  Pulse 67  Temp(Src) 98.1 F (36.7 C) (Oral)   Resp 20  Ht 6' (1.829 m)  Wt 265 lb (120.203 kg)  BMI 35.93 kg/m2  SpO2 100%  Physical Exam  Nursing note and vitals reviewed. Constitutional: He is oriented to person, place, and time. He appears well-developed and well-nourished. No distress.  HENT:  Head: Normocephalic and atraumatic.  Eyes: Conjunctivae are normal.  Neck: Normal range of motion. Neck supple.  Cardiovascular: Normal rate, regular rhythm and intact distal pulses.  Exam reveals no gallop and no friction rub.   No murmur heard. Pulmonary/Chest: Effort normal and breath sounds normal. He has no wheezes. He has no rales. He exhibits no tenderness.  Abdominal: Soft. There is no tenderness.  Musculoskeletal:  Left shoulder ROM limited due to pain. No obvious deformity. Pain most notable with active abduction. Patient able to actively internally and externally rotate but does endorse pain.   Neurological: He is alert and oriented to person, place, and time. Coordination normal.  Upper extremity sensation intact bilaterally. Speech is goal-oriented. Moves limbs without ataxia.   Skin: Skin is warm and dry.  Psychiatric: He has a normal mood and affect. His behavior is normal.    ED Course  Procedures (including critical care time)  Labs Reviewed - No data to  display Dg Shoulder Left  06/07/2012  *RADIOLOGY REPORT*  Clinical Data: Injury, pain  LEFT SHOULDER - 2+ VIEW  Comparison: None.  Findings: Normal alignment without acute fracture.  No subluxation or dislocation.  No degenerative process.  AC joint aligned. Visualized left ribs intact.  Left upper lobe clear.  IMPRESSION: No acute finding   Original Report Authenticated By: Judie Petit. Shick, M.D.      1. Shoulder injury, left, initial encounter       MDM  12:22 PM Plain film of left shoulder pending. Patient will have toradol and flexeril for pain. No signs of neurovascular compromise.   1:21 PM Xray shows no signs of fracture or dislocation. Shoulder immobilizer  applied for comfort. Patient will have recommended Orthopedic follow up for further evaluation and management of symptoms. Patient instructed to return to the ED with worsening or concerning symptoms.      Emilia Beck, PA-C 06/07/12 1343

## 2012-10-22 ENCOUNTER — Emergency Department (HOSPITAL_BASED_OUTPATIENT_CLINIC_OR_DEPARTMENT_OTHER)
Admission: EM | Admit: 2012-10-22 | Discharge: 2012-10-22 | Disposition: A | Discharge status: Home / Self Care | Payer: 59 | Attending: Emergency Medicine | Admitting: Emergency Medicine

## 2012-10-22 ENCOUNTER — Encounter (HOSPITAL_BASED_OUTPATIENT_CLINIC_OR_DEPARTMENT_OTHER): Payer: Self-pay | Admitting: *Deleted

## 2012-10-22 DIAGNOSIS — Y929 Unspecified place or not applicable: Secondary | ICD-10-CM | POA: Insufficient documentation

## 2012-10-22 DIAGNOSIS — F329 Major depressive disorder, single episode, unspecified: Secondary | ICD-10-CM | POA: Insufficient documentation

## 2012-10-22 DIAGNOSIS — F411 Generalized anxiety disorder: Secondary | ICD-10-CM | POA: Insufficient documentation

## 2012-10-22 DIAGNOSIS — IMO0002 Reserved for concepts with insufficient information to code with codable children: Secondary | ICD-10-CM | POA: Insufficient documentation

## 2012-10-22 DIAGNOSIS — Z79899 Other long term (current) drug therapy: Secondary | ICD-10-CM | POA: Insufficient documentation

## 2012-10-22 DIAGNOSIS — F3289 Other specified depressive episodes: Secondary | ICD-10-CM | POA: Insufficient documentation

## 2012-10-22 DIAGNOSIS — S39012A Strain of muscle, fascia and tendon of lower back, initial encounter: Secondary | ICD-10-CM

## 2012-10-22 DIAGNOSIS — Y93B3 Activity, free weights: Secondary | ICD-10-CM | POA: Insufficient documentation

## 2012-10-22 DIAGNOSIS — X58XXXA Exposure to other specified factors, initial encounter: Secondary | ICD-10-CM | POA: Insufficient documentation

## 2012-10-22 DIAGNOSIS — Z880 Allergy status to penicillin: Secondary | ICD-10-CM | POA: Insufficient documentation

## 2012-10-22 LAB — URINALYSIS, ROUTINE W REFLEX MICROSCOPIC
Bilirubin Urine: NEGATIVE
Glucose, UA: NEGATIVE mg/dL
Leukocytes, UA: NEGATIVE
Nitrite: NEGATIVE
Specific Gravity, Urine: 1.007 (ref 1.005–1.030)
pH: 7 (ref 5.0–8.0)

## 2012-10-22 MED ORDER — OXYCODONE-ACETAMINOPHEN 5-325 MG PO TABS: 2.0000 | ORAL_TABLET | ORAL | Status: DC | PRN

## 2012-10-22 MED ORDER — CYCLOBENZAPRINE HCL 10 MG PO TABS: 10.0000 mg | ORAL_TABLET | Freq: Two times a day (BID) | ORAL | Status: DC | PRN

## 2012-10-22 MED ORDER — DEXAMETHASONE SODIUM PHOSPHATE 10 MG/ML IJ SOLN
10.0000 mg | Freq: Once | INTRAMUSCULAR | Status: AC
  Administered 2012-10-22: 10 mg via INTRAMUSCULAR
  Filled 2012-10-22: qty 1

## 2012-10-22 NOTE — ED Notes (Signed)
Mid to low back pain feels has injured back and is getting progressively worse since Monday. Has been using ice packs and taking ibuprofen pain worse with walking today at office was uable to sit up straight due to pain

## 2012-10-22 NOTE — ED Provider Notes (Signed)
CSN: [id number]     Arrival date & time 10/22/12  0902 History     First MD Initiated Contact with Patient 10/22/12 0914     Chief Complaint  Patient presents with  . Back Pain   (Consider location/radiation/quality/duration/timing/severity/associated sxs/prior Treatment) HPI Comments: Patient presents with a four-day history of lower back pain. He states that it started after he was doing some squats while weightlifting. He states he hasn't done spots about 3 years because it causes similar type pain 3 years ago. He states it's constant throbbing pain and has gradually gotten worse. There's no radiation of the pain down his legs. He denies any numbness or weakness in his legs. He denies any abdominal pain nausea or vomiting. He denies any urinary symptoms or incontinence. He states it's worse with movement and sitting in certain positions.  Patient is a 52 y.o. male presenting with back pain.  Back Pain Associated symptoms: no abdominal pain, no chest pain, no fever, no headaches, no numbness and no weakness     Past Medical History  Diagnosis Date  . Anxiety   . Depression    History reviewed. No pertinent past surgical history. History reviewed. No pertinent family history. History  Substance Use Topics  . Smoking status: Never Smoker   . Smokeless tobacco: Current User    Types: Snuff  . Alcohol Use: No    Review of Systems  Constitutional: Negative for fever, chills, diaphoresis and fatigue.  HENT: Negative for congestion, rhinorrhea and sneezing.   Eyes: Negative.   Respiratory: Negative for cough, chest tightness and shortness of breath.   Cardiovascular: Negative for chest pain and leg swelling.  Gastrointestinal: Negative for nausea, vomiting, abdominal pain, diarrhea and blood in stool.  Genitourinary: Negative for frequency, hematuria, flank pain and difficulty urinating.  Musculoskeletal: Positive for back pain. Negative for arthralgias.  Skin: Negative for rash.   Neurological: Negative for dizziness, speech difficulty, weakness, numbness and headaches.    Allergies  Penicillins  Home Medications   Current Outpatient Rx  Name  Route  Sig  Dispense  Refill  . ALPRAZolam (XANAX) 0.5 MG tablet   Oral   Take 0.5 mg by mouth every 4 (four) hours.           . gabapentin (NEURONTIN) 300 MG capsule   Oral   Take 300 mg by mouth 4 (four) times daily.           . cyclobenzaprine (FLEXERIL) 10 MG tablet   Oral   Take 1 tablet (10 mg total) by mouth 2 (two) times daily as needed for muscle spasms.   20 tablet   0   . cyclobenzaprine (FLEXERIL) 10 MG tablet   Oral   Take 1 tablet (10 mg total) by mouth 2 (two) times daily as needed for muscle spasms.   20 tablet   0   . ibuprofen (ADVIL,MOTRIN) 600 MG tablet   Oral   Take 1 tablet (600 mg total) by mouth every 6 (six) hours as needed for pain.   30 tablet   0   . oseltamivir (TAMIFLU) 75 MG capsule   Oral   Take 1 capsule (75 mg total) by mouth every 12 (twelve) hours.   10 capsule   0   . oxyCODONE-acetaminophen (PERCOCET) 5-325 MG per tablet   Oral   Take 2 tablets by mouth every 4 (four) hours as needed for pain.   20 tablet   0   . traMADol (ULTRAM) 50  MG tablet   Oral   Take 1 tablet (50 mg total) by mouth every 6 (six) hours as needed for pain.   15 tablet   0    BP 139/79  Pulse 62  Temp(Src) 97.9 F (36.6 C) (Oral)  Resp 14  Ht 6' (1.829 m)  Wt 245 lb (111.131 kg)  BMI 33.22 kg/m2  SpO2 100% Physical Exam  Constitutional: He is oriented to person, place, and time. He appears well-developed and well-nourished.  HENT:  Head: Normocephalic and atraumatic.  Eyes: Pupils are equal, round, and reactive to light.  Neck: Normal range of motion. Neck supple.  Cardiovascular: Normal rate, regular rhythm and normal heart sounds.   Pulmonary/Chest: Effort normal and breath sounds normal. No respiratory distress. He has no wheezes. He has no rales. He exhibits no  tenderness.  Abdominal: Soft. Bowel sounds are normal. There is no tenderness. There is no rebound and no guarding.  Musculoskeletal: Normal range of motion. He exhibits no edema.  Tenderness along the paraspinal muscles in the L3-L5 area. Negative straight leg raise bilaterally. Patellar reflexes symmetric bilaterally. Distal pulses are intact in the feet.  Lymphadenopathy:    He has no cervical adenopathy.  Neurological: He is alert and oriented to person, place, and time. He has normal strength. No sensory deficit.  Normal sensation and motor function in the legs  Skin: Skin is warm and dry. No rash noted.  Psychiatric: He has a normal mood and affect.    ED Course   Procedures (including critical care time)  Labs Reviewed  URINALYSIS, ROUTINE W REFLEX MICROSCOPIC   No results found. 1. Back strain, initial encounter     MDM  Patient with likely musculoskeletal back pain. He has no urinary symptoms or evidence of a kidney stone. He is neurologically intact with nothing suggestive of cauda equina. He was given a dose of Decadron in the ED and prescription for Flexeril and Percocet for pain. He was encouraged to followup with his primary care physician with [facility].  Rolan Bucco, MD 10/22/12 [phone]

## 2013-05-31 ENCOUNTER — Ambulatory Visit: Payer: 59 | Admitting: Family Medicine

## 2013-05-31 ENCOUNTER — Telehealth: Payer: Self-pay | Admitting: Internal Medicine

## 2013-05-31 NOTE — Telephone Encounter (Signed)
Patient would like to switch his primary care from Dr Drue NovelPaz to Dr Laury AxonLowne. Please advise

## 2013-05-31 NOTE — Telephone Encounter (Signed)
ok 

## 2013-05-31 NOTE — Telephone Encounter (Signed)
Ok with me 

## 2013-06-08 ENCOUNTER — Encounter (HOSPITAL_BASED_OUTPATIENT_CLINIC_OR_DEPARTMENT_OTHER): Payer: Self-pay | Admitting: Emergency Medicine

## 2013-06-08 ENCOUNTER — Emergency Department (HOSPITAL_BASED_OUTPATIENT_CLINIC_OR_DEPARTMENT_OTHER)
Admission: EM | Admit: 2013-06-08 | Discharge: 2013-06-08 | Disposition: A | Discharge status: Home / Self Care | Payer: 59 | Attending: Emergency Medicine | Admitting: Emergency Medicine

## 2013-06-08 ENCOUNTER — Emergency Department (HOSPITAL_BASED_OUTPATIENT_CLINIC_OR_DEPARTMENT_OTHER): Payer: 59

## 2013-06-08 DIAGNOSIS — S99919A Unspecified injury of unspecified ankle, initial encounter: Principal | ICD-10-CM

## 2013-06-08 DIAGNOSIS — F3289 Other specified depressive episodes: Secondary | ICD-10-CM | POA: Insufficient documentation

## 2013-06-08 DIAGNOSIS — X500XXA Overexertion from strenuous movement or load, initial encounter: Secondary | ICD-10-CM | POA: Insufficient documentation

## 2013-06-08 DIAGNOSIS — F329 Major depressive disorder, single episode, unspecified: Secondary | ICD-10-CM | POA: Insufficient documentation

## 2013-06-08 DIAGNOSIS — S99929A Unspecified injury of unspecified foot, initial encounter: Principal | ICD-10-CM

## 2013-06-08 DIAGNOSIS — Y92838 Other recreation area as the place of occurrence of the external cause: Secondary | ICD-10-CM

## 2013-06-08 DIAGNOSIS — Y9239 Other specified sports and athletic area as the place of occurrence of the external cause: Secondary | ICD-10-CM | POA: Insufficient documentation

## 2013-06-08 DIAGNOSIS — W219XXA Striking against or struck by unspecified sports equipment, initial encounter: Secondary | ICD-10-CM | POA: Insufficient documentation

## 2013-06-08 DIAGNOSIS — S8990XA Unspecified injury of unspecified lower leg, initial encounter: Secondary | ICD-10-CM | POA: Insufficient documentation

## 2013-06-08 DIAGNOSIS — Y9375 Activity, martial arts: Secondary | ICD-10-CM | POA: Insufficient documentation

## 2013-06-08 DIAGNOSIS — F411 Generalized anxiety disorder: Secondary | ICD-10-CM | POA: Insufficient documentation

## 2013-06-08 DIAGNOSIS — Z79899 Other long term (current) drug therapy: Secondary | ICD-10-CM | POA: Insufficient documentation

## 2013-06-08 DIAGNOSIS — Z880 Allergy status to penicillin: Secondary | ICD-10-CM | POA: Insufficient documentation

## 2013-06-08 DIAGNOSIS — M25561 Pain in right knee: Secondary | ICD-10-CM

## 2013-06-08 DIAGNOSIS — E663 Overweight: Secondary | ICD-10-CM | POA: Insufficient documentation

## 2013-06-08 MED ORDER — IBUPROFEN 600 MG PO TABS: 600.0000 mg | ORAL_TABLET | Freq: Four times a day (QID) | ORAL | Status: DC | PRN

## 2013-06-08 MED ORDER — KETOROLAC TROMETHAMINE 60 MG/2ML IM SOLN
30.0000 mg | Freq: Once | INTRAMUSCULAR | Status: AC
  Administered 2013-06-08: 30 mg via INTRAMUSCULAR
  Filled 2013-06-08: qty 2

## 2013-06-08 MED ORDER — HYDROCODONE-ACETAMINOPHEN 5-325 MG PO TABS: 1.0000 | ORAL_TABLET | Freq: Four times a day (QID) | ORAL | Status: DC | PRN

## 2013-06-08 NOTE — ED Provider Notes (Signed)
CSN: [id number]     Arrival date & time 06/08/13  1020 History   First MD Initiated Contact with Patient 06/08/13 1031     Chief Complaint  Patient presents with  . Knee Pain     (Consider location/radiation/quality/duration/timing/severity/associated sxs/prior Treatment) HPI  This a 53 year old male who presents with knee pain. Patient reports that he was in martial arts class II days ago when he got kicked over the medial right knee. He reported pain at that time. He has been ambulatory. Patient states that last night he took a second class that was approximately 2 hours long. This morning he woke up with increased pain. He reports stiffness and "my leg gave out."  He reports 4/10 pain.   Past Medical History  Diagnosis Date  . Anxiety   . Depression    History reviewed. No pertinent past surgical history. No family history on file. History  Substance Use Topics  . Smoking status: Never Smoker   . Smokeless tobacco: Current User    Types: Snuff  . Alcohol Use: No    Review of Systems  Musculoskeletal: Positive for joint swelling.       Right knee pain  Neurological: Negative for weakness and numbness.  All other systems reviewed and are negative.      Allergies  Penicillins  Home Medications   Current Outpatient Rx  Name  Route  Sig  Dispense  Refill  . ALPRAZolam (XANAX) 0.5 MG tablet   Oral   Take 0.5 mg by mouth every 4 (four) hours.           . cyclobenzaprine (FLEXERIL) 10 MG tablet   Oral   Take 1 tablet (10 mg total) by mouth 2 (two) times daily as needed for muscle spasms.   20 tablet   0   . cyclobenzaprine (FLEXERIL) 10 MG tablet   Oral   Take 1 tablet (10 mg total) by mouth 2 (two) times daily as needed for muscle spasms.   20 tablet   0   . gabapentin (NEURONTIN) 300 MG capsule   Oral   Take 300 mg by mouth 4 (four) times daily.           Marland Kitchen. HYDROcodone-acetaminophen (NORCO/VICODIN) 5-325 MG per tablet   Oral   Take 1 tablet by  mouth every 6 (six) hours as needed for moderate pain.   10 tablet   0   . ibuprofen (ADVIL,MOTRIN) 600 MG tablet   Oral   Take 1 tablet (600 mg total) by mouth every 6 (six) hours as needed for pain.   30 tablet   0   . ibuprofen (ADVIL,MOTRIN) 600 MG tablet   Oral   Take 1 tablet (600 mg total) by mouth every 6 (six) hours as needed.   30 tablet   0   . oseltamivir (TAMIFLU) 75 MG capsule   Oral   Take 1 capsule (75 mg total) by mouth every 12 (twelve) hours.   10 capsule   0   . oxyCODONE-acetaminophen (PERCOCET) 5-325 MG per tablet   Oral   Take 2 tablets by mouth every 4 (four) hours as needed for pain.   20 tablet   0   . traMADol (ULTRAM) 50 MG tablet   Oral   Take 1 tablet (50 mg total) by mouth every 6 (six) hours as needed for pain.   15 tablet   0    BP 137/76  Pulse 89  Temp(Src) 98.1 F (36.7 C) (  Oral)  Resp 16  SpO2 100% Physical Exam  Nursing note and vitals reviewed. Constitutional: He is oriented to person, place, and time. He appears well-developed and well-nourished.  overweight  HENT:  Head: Normocephalic and atraumatic.  Cardiovascular: Normal rate and regular rhythm.   Pulmonary/Chest: Effort normal. No respiratory distress.  Musculoskeletal: He exhibits no edema.  Focused examination of the right knee:  Full active and passive range of motion, no significant effusion noted, no overlying skin changes or abrasion noted, there is medial joint line tenderness  Lymphadenopathy:    He has no cervical adenopathy.  Neurological: He is alert and oriented to person, place, and time.  Skin: Skin is warm and dry.  Psychiatric: He has a normal mood and affect.    ED Course  Procedures (including critical care time) Labs Review Labs Reviewed - No data to display Imaging Review Dg Knee Complete 4 Views Right  06/08/2013   CLINICAL DATA:  Right knee pain after injury.  EXAM: RIGHT KNEE - COMPLETE 4+ VIEW  COMPARISON:  None.  FINDINGS: There is no  evidence of fracture, dislocation, or joint effusion. There is no evidence of arthropathy or other focal bone abnormality. Soft tissues are unremarkable.  IMPRESSION: Normal right knee.   Electronically Signed   By: Roque Lias M.D.   On: 06/08/2013 11:13     EKG Interpretation None      MDM   Final diagnoses:  Right knee pain    Patient presents with right knee pain following an injury 2 days ago. He is nontoxic on exam. Exam only notable for medial joint line tenderness. There is no joint laxity noted. Suspect possible meniscus injury. I discussed the patient rest, ice, compression, and elevation. Patient will be a short course of pain medication. Plain films are negative for fracture. If patient does not improve with conservative therapy, patient given sports medicine followup.  After history, exam, and medical workup I feel the patient has been appropriately medically screened and is safe for discharge home. Pertinent diagnoses were discussed with the patient. Patient was given return precautions.     Shon Baton, MD 06/08/13 1125

## 2013-06-08 NOTE — Discharge Instructions (Signed)
Knee Pain Knee pain can be a result of an injury or other medical conditions. Treatment will depend on the cause of your pain. HOME CARE  Only take medicine as told by your doctor.  Keep a healthy weight. Being overweight can make the knee hurt more.  Stretch before exercising or playing sports.  If there is constant knee pain, change the way you exercise. Ask your doctor for advice.  Make sure shoes fit well. Choose the right shoe for the sport or activity.  Protect your knees. Wear kneepads if needed.  Rest when you are tired. GET HELP RIGHT AWAY IF:   Your knee pain does not stop.  Your knee pain does not get better.  Your knee joint feels hot to the touch.  You have a fever. MAKE SURE YOU:   Understand these instructions.  Will watch this condition.  Will get help right away if you are not doing well or get worse. Document Released: 06/06/2008 Document Revised: 06/02/2011 Document Reviewed: 06/06/2008 Exit[employer] Patient Information 2014 [employer].  

## 2013-06-08 NOTE — ED Notes (Addendum)
Pt c/o medial right knee pain after being kicked in martial arts class 2 days ago. Denies h/o prior knee pain.

## 2014-05-02 ENCOUNTER — Emergency Department (HOSPITAL_BASED_OUTPATIENT_CLINIC_OR_DEPARTMENT_OTHER): Payer: Managed Care, Other (non HMO)

## 2014-05-02 ENCOUNTER — Emergency Department (HOSPITAL_BASED_OUTPATIENT_CLINIC_OR_DEPARTMENT_OTHER)
Admission: EM | Admit: 2014-05-02 | Discharge: 2014-05-02 | Disposition: A | Discharge status: Home / Self Care | Payer: Managed Care, Other (non HMO) | Attending: Emergency Medicine | Admitting: Emergency Medicine

## 2014-05-02 ENCOUNTER — Encounter (HOSPITAL_BASED_OUTPATIENT_CLINIC_OR_DEPARTMENT_OTHER): Payer: Self-pay

## 2014-05-02 DIAGNOSIS — Z79899 Other long term (current) drug therapy: Secondary | ICD-10-CM | POA: Diagnosis not present

## 2014-05-02 DIAGNOSIS — F329 Major depressive disorder, single episode, unspecified: Secondary | ICD-10-CM | POA: Diagnosis not present

## 2014-05-02 DIAGNOSIS — R05 Cough: Secondary | ICD-10-CM | POA: Insufficient documentation

## 2014-05-02 DIAGNOSIS — Z880 Allergy status to penicillin: Secondary | ICD-10-CM | POA: Insufficient documentation

## 2014-05-02 DIAGNOSIS — R55 Syncope and collapse: Secondary | ICD-10-CM | POA: Diagnosis present

## 2014-05-02 DIAGNOSIS — F419 Anxiety disorder, unspecified: Secondary | ICD-10-CM | POA: Diagnosis not present

## 2014-05-02 DIAGNOSIS — R059 Cough, unspecified: Secondary | ICD-10-CM

## 2014-05-02 LAB — BASIC METABOLIC PANEL
Anion gap: 2 — ABNORMAL LOW (ref 5–15)
BUN: 17 mg/dL (ref 6–23)
CHLORIDE: 105 mmol/L (ref 96–112)
CO2: 27 mmol/L (ref 19–32)
Calcium: 9 mg/dL (ref 8.4–10.5)
Creatinine, Ser: 0.9 mg/dL (ref 0.50–1.35)
GFR calc Af Amer: >90 mL/min (ref >90)
GFR calc non Af Amer: >90 mL/min (ref >90)
GLUCOSE: 95 mg/dL (ref 70–99)
POTASSIUM: 4.5 mmol/L (ref 3.5–5.1)
Sodium: 134 mmol/L — ABNORMAL LOW (ref 135–145)

## 2014-05-02 LAB — CBC WITH DIFFERENTIAL/PLATELET
Basophils Absolute: 0 10*3/uL (ref 0.0–0.1)
Basophils Relative: 0 % (ref 0–1)
Eosinophils Absolute: 0.2 10*3/uL (ref 0.0–0.7)
Eosinophils Relative: 3 % (ref 0–5)
HEMATOCRIT: 42 % (ref 39.0–52.0)
HEMOGLOBIN: 14.1 g/dL (ref 13.0–17.0)
LYMPHS PCT: 27 % (ref 12–46)
Lymphs Abs: 1.2 10*3/uL (ref 0.7–4.0)
MCH: 32.9 pg (ref 26.0–34.0)
MCHC: 33.6 g/dL (ref 30.0–36.0)
MCV: 98.1 fL (ref 78.0–100.0)
MONO ABS: 0.4 10*3/uL (ref 0.1–1.0)
MONOS PCT: 9 % (ref 3–12)
NEUTROS ABS: 2.8 10*3/uL (ref 1.7–7.7)
NEUTROS PCT: 61 % (ref 43–77)
Platelets: 214 10*3/uL (ref 150–400)
RBC: 4.28 MIL/uL (ref 4.22–5.81)
RDW: 12.7 % (ref 11.5–15.5)
WBC: 4.6 10*3/uL (ref 4.0–10.5)

## 2014-05-02 LAB — TROPONIN I

## 2014-05-02 MED ORDER — SODIUM CHLORIDE 0.9 % IV BOLUS (SEPSIS)
1000.0000 mL | Freq: Once | INTRAVENOUS | Status: AC
  Administered 2014-05-02: 1000 mL via INTRAVENOUS

## 2014-05-02 MED ORDER — DEXAMETHASONE SODIUM PHOSPHATE 10 MG/ML IJ SOLN
8.0000 mg | Freq: Once | INTRAMUSCULAR | Status: AC
  Administered 2014-05-02: 8 mg via INTRAMUSCULAR
  Filled 2014-05-02: qty 1

## 2014-05-02 MED ORDER — DEXAMETHASONE 0.5 MG PO TABS: ORAL_TABLET | ORAL | Status: DC

## 2014-05-02 MED ORDER — AZITHROMYCIN 250 MG PO TABS: 250.0000 mg | ORAL_TABLET | Freq: Every day | ORAL | Status: DC

## 2014-05-02 NOTE — ED Notes (Signed)
Patient transported to X-ray 

## 2014-05-02 NOTE — ED Notes (Signed)
Pt reports syncopal episodes this morning when at work. Sts cough, cold, congestion since Friday. Reports headache and sore throat since. Sts that he was getting a bottle out of the fridge when he passed out.

## 2014-05-02 NOTE — ED Provider Notes (Signed)
CSN: [id number]     Arrival date & time 05/02/14  0920 History   None    Chief Complaint  Patient presents with  . Loss of Consciousness      HPI Pt reports syncopal episodes this morning when at work. Sts cough, cold, congestion since Friday. Reports headache and sore throat since. Sts that he was getting a bottle out of the fridge when he passed out.  Past Medical History  Diagnosis Date  . Anxiety   . Depression    History reviewed. No pertinent past surgical history. No family history on file. History  Substance Use Topics  . Smoking status: Never Smoker   . Smokeless tobacco: Current User    Types: Snuff  . Alcohol Use: No    Review of Systems  Constitutional: Negative for fever, chills, diaphoresis, fatigue and unexpected weight change.  Respiratory: Positive for cough. Negative for wheezing.   Cardiovascular: Negative for chest pain.  Neurological: Negative for seizures, speech difficulty, numbness and headaches.  All other systems reviewed and are negative.     Allergies  Penicillins  Home Medications   Prior to Admission medications   Medication Sig Start Date End Date Taking? Authorizing Provider  ALPRAZolam Prudy Feeler(XANAX) 0.5 MG tablet Take 0.5 mg by mouth every 4 (four) hours.      Historical Provider, MD  azithromycin (ZITHROMAX) 250 MG tablet Take 1 tablet (250 mg total) by mouth daily. Take first 2 tablets together, then 1 every day until finished. 05/02/14   Nelia Shiobert L Gracia Saggese, MD  dexamethasone (DECADRON) 0.5 MG tablet Day 1: 4-8 mg IM Day 2/3: 3 mg/d div BID PO Day 4: 1.5 mg/d div BID PO Day 5/6: 0.50 mg PO qD 05/02/14   Nelia Shiobert L Margarita Croke, MD  gabapentin (NEURONTIN) 300 MG capsule Take 300 mg by mouth 4 (four) times daily.      Historical Provider, MD   BP 124/83 mmHg  Pulse 58  Temp(Src) 98.4 F (36.9 C) (Oral)  Resp 18  Ht 6' (1.829 m)  Wt 240 lb (108.863 kg)  BMI 32.54 kg/m2  SpO2 98% Physical Exam Physical Exam  Nursing note and vitals  reviewed. Constitutional: He is oriented to person, place, and time. He appears well-developed and well-nourished. No distress.  HENT:  Head: Normocephalic and atraumatic.  Eyes: Pupils are equal, round, and reactive to light.  Neck: Normal range of motion.  Cardiovascular: Normal rate and intact distal pulses.   Pulmonary/Chest: No respiratory distress.  Patient does have a paroxysmal cough.   Abdominal: Normal appearance. He exhibits no distension.  Musculoskeletal: Normal range of motion.  Neurological: He is alert and oriented to person, place, and time. No cranial nerve deficit.  no motor deficit.  Gait is normal.  Speech is normal. Skin: Skin is warm and dry. No rash noted.  Psychiatric: He has a normal mood and affect. His behavior is normal.   ED Course  Procedures (including critical care time) Medications  dexamethasone (DECADRON) injection 8 mg (not administered)  sodium chloride 0.9 % bolus 1,000 mL (0 mLs Intravenous Stopped 05/02/14 1107)    Labs Review Labs Reviewed  BASIC METABOLIC PANEL - Abnormal; Notable for the following:    Sodium 134 (*)    Anion gap 2 (*)    All other components within normal limits  CBC WITH DIFFERENTIAL/PLATELET  TROPONIN I    Imaging Review Dg Chest 2 View  05/02/2014   CLINICAL DATA:  Syncope.  Cough and chest congestion.  EXAM:  CHEST  2 VIEW  COMPARISON:  06/29/2008  FINDINGS: The heart size and mediastinal contours are within normal limits. Both lungs are clear. The visualized skeletal structures are unremarkable.  IMPRESSION: Normal exam.   Electronically Signed   By: Francene Boyers M.D.   On: 05/02/2014 10:38     EKG Interpretation   Date/Time:  Tuesday May 02 2014 09:42:15 EST Ventricular Rate:  66 PR Interval:  178 QRS Duration: 98 QT Interval:  398 QTC Calculation: 417 R Axis:   17 Text Interpretation:  Normal sinus rhythm with sinus arrhythmia Normal ECG  Confirmed by Radford Pax  MD, Anushka Hartinger ([id number]) on 05/02/2014 9:49:31  AM      MDM   Final diagnoses:  Cough        Nelia Shi, MD 05/02/14 1143

## 2014-05-02 NOTE — Discharge Instructions (Signed)
Cough, Adult  A cough is a reflex that helps clear your throat and airways. It can help heal the body or may be a reaction to an irritated airway. A cough may only last 2 or 3 weeks (acute) or may last more than 8 weeks (chronic).  CAUSES Acute cough:  Viral or bacterial infections. Chronic cough:  Infections.  Allergies.  Asthma.  Post-nasal drip.  Smoking.  Heartburn or acid reflux.  Some medicines.  Chronic lung problems (COPD).  Cancer. SYMPTOMS   Cough.  Fever.  Chest pain.  Increased breathing rate.  High-pitched whistling sound when breathing (wheezing).  Colored mucus that you cough up (sputum). TREATMENT   A bacterial cough may be treated with antibiotic medicine.  A viral cough must run its course and will not respond to antibiotics.  Your caregiver may recommend other treatments if you have a chronic cough. HOME CARE INSTRUCTIONS   Only take over-the-counter or prescription medicines for pain, discomfort, or fever as directed by your caregiver. Use cough suppressants only as directed by your caregiver.  Use a cold steam vaporizer or humidifier in your bedroom or home to help loosen secretions.  Sleep in a semi-upright position if your cough is worse at night.  Rest as needed.  Stop smoking if you smoke. SEEK IMMEDIATE MEDICAL CARE IF:   You have pus in your sputum.  Your cough starts to worsen.  You cannot control your cough with suppressants and are losing sleep.  You begin coughing up blood.  You have difficulty breathing.  You develop pain which is getting worse or is uncontrolled with medicine.  You have a fever. MAKE SURE YOU:   Understand these instructions.  Will watch your condition.  Will get help right away if you are not doing well or get worse. Document Released: 09/06/2010 Document Revised: 06/02/2011 Document Reviewed: 09/06/2010 Exit[employer] Patient Information 2015 [employer]. This information is not intended  to replace advice given to you by your health care provider. Make sure you discuss any questions you have with your health care provider.  

## 2014-05-02 NOTE — ED Notes (Signed)
MD at bedside. 

## 2014-11-26 ENCOUNTER — Encounter (HOSPITAL_BASED_OUTPATIENT_CLINIC_OR_DEPARTMENT_OTHER): Payer: Self-pay | Admitting: *Deleted

## 2014-11-26 ENCOUNTER — Emergency Department (HOSPITAL_BASED_OUTPATIENT_CLINIC_OR_DEPARTMENT_OTHER)
Admission: EM | Admit: 2014-11-26 | Discharge: 2014-11-26 | Disposition: A | Discharge status: Home / Self Care | Payer: 59 | Attending: Emergency Medicine | Admitting: Emergency Medicine

## 2014-11-26 DIAGNOSIS — F419 Anxiety disorder, unspecified: Secondary | ICD-10-CM | POA: Insufficient documentation

## 2014-11-26 DIAGNOSIS — Z880 Allergy status to penicillin: Secondary | ICD-10-CM | POA: Insufficient documentation

## 2014-11-26 DIAGNOSIS — L235 Allergic contact dermatitis due to other chemical products: Secondary | ICD-10-CM | POA: Insufficient documentation

## 2014-11-26 DIAGNOSIS — F329 Major depressive disorder, single episode, unspecified: Secondary | ICD-10-CM | POA: Insufficient documentation

## 2014-11-26 DIAGNOSIS — Z79899 Other long term (current) drug therapy: Secondary | ICD-10-CM | POA: Insufficient documentation

## 2014-11-26 MED ORDER — FLUOCINONIDE-E 0.05 % EX CREA: 1.0000 "application " | TOPICAL_CREAM | Freq: Two times a day (BID) | CUTANEOUS | Status: DC

## 2014-11-26 NOTE — Discharge Instructions (Signed)

## 2014-11-26 NOTE — ED Provider Notes (Signed)
CSN: [id number]     Arrival date & time 11/26/14  0719 History   First MD Initiated Contact with Patient 11/26/14 [phone]     Chief Complaint  Patient presents with  . Rash    HPI  54 year old [place] presents with 5 day history of itchy rash on lower legs after changing his shin guards to "[employer]" one month ago. Has not tried new socks recently. He denies fever, chills, or other complaints.  Past Medical History  Diagnosis Date  . Anxiety   . Depression    History reviewed. No pertinent past surgical history. No family history on file. Social History  Substance Use Topics  . Smoking status: Never Smoker   . Smokeless tobacco: Current User    Types: Snuff  . Alcohol Use: No    Review of Systems  Constitutional: Negative for fever.  Respiratory: Negative for shortness of breath and stridor.   Cardiovascular: Negative for chest pain.  Gastrointestinal: Negative for abdominal pain.  Musculoskeletal: Negative for arthralgias.  Skin: Positive for rash.  Neurological: Negative for dizziness.    Allergies  Penicillins  Home Medications   Prior to Admission medications   Medication Sig Start Date End Date Taking? Authorizing Provider  ALPRAZolam Prudy Feeler) 0.5 MG tablet Take 0.5 mg by mouth every 4 (four) hours.      Historical Provider, MD  azithromycin (ZITHROMAX) 250 MG tablet Take 1 tablet (250 mg total) by mouth daily. Take first 2 tablets together, then 1 every day until finished. 05/02/14   Nelva Nay, MD  dexamethasone (DECADRON) 0.5 MG tablet Day 1: 4-8 mg IM Day 2/3: 3 mg/d div BID PO Day 4: 1.5 mg/d div BID PO Day 5/6: 0.50 mg PO qD 05/02/14   Nelva Nay, MD  gabapentin (NEURONTIN) 300 MG capsule Take 300 mg by mouth 4 (four) times daily.      Historical Provider, MD   BP 120/68 mmHg  Pulse 58  Temp(Src) 98.5 F (36.9 C) (Oral)  Resp 18  Ht 6' (1.829 m)  Wt 245 lb (111.131 kg)  BMI 33.22 kg/m2  SpO2 98% Physical Exam  Constitutional: He appears  well-developed and well-nourished.  HENT:  Head: Normocephalic and atraumatic.  Eyes: Conjunctivae and EOM are normal.  Neck: Normal range of motion.  Cardiovascular: Normal rate and regular rhythm.   Pulmonary/Chest: Effort normal and breath sounds normal.  Abdominal: Soft.  Skin:  Numerous dishydrotic vesicles on anterior bilateral lower legs, well-demarcated in shin guard distribution.    ED Course  Procedures (including critical care time) Labs Review Labs Reviewed - No data to display  Imaging Review No results found. I have personally reviewed and evaluated these images and lab results as part of my medical decision-making.   EKG Interpretation None      MDM   Final diagnoses:  Allergic contact dermatitis due to rubber chemical    54 year old [place] present with itchy red rash over shinguard distribution after getting a new pair about a month ago, consistent with allergic contact dermatitis. He still  has his old shinguards so he will start wearing those during his next martial arts training sessions. I prescribed fluocinonide and instructed him to put this on his skin this until the bumps go away. He can wrap his legs in Saran wrap for 2 nights then that should get rid of it. I also recommended diphenhydramine as needed at night.   Selina Cooley, MD 11/26/14 8413  Rolan Bucco, MD 11/26/14 [phone]

## 2014-11-26 NOTE — ED Notes (Addendum)
Patient c/o rash that started last Wednesday, bilateral lower legs

## 2015-08-06 ENCOUNTER — Emergency Department (HOSPITAL_BASED_OUTPATIENT_CLINIC_OR_DEPARTMENT_OTHER): Payer: Non-veteran care

## 2015-08-06 ENCOUNTER — Encounter (HOSPITAL_BASED_OUTPATIENT_CLINIC_OR_DEPARTMENT_OTHER): Payer: Self-pay | Admitting: *Deleted

## 2015-08-06 ENCOUNTER — Emergency Department (HOSPITAL_BASED_OUTPATIENT_CLINIC_OR_DEPARTMENT_OTHER)
Admission: EM | Admit: 2015-08-06 | Discharge: 2015-08-06 | Disposition: A | Discharge status: Home / Self Care | Payer: Non-veteran care | Attending: Emergency Medicine | Admitting: Emergency Medicine

## 2015-08-06 DIAGNOSIS — M25562 Pain in left knee: Secondary | ICD-10-CM | POA: Diagnosis not present

## 2015-08-06 DIAGNOSIS — Y939 Activity, unspecified: Secondary | ICD-10-CM | POA: Insufficient documentation

## 2015-08-06 DIAGNOSIS — F329 Major depressive disorder, single episode, unspecified: Secondary | ICD-10-CM | POA: Diagnosis not present

## 2015-08-06 DIAGNOSIS — Y929 Unspecified place or not applicable: Secondary | ICD-10-CM | POA: Diagnosis not present

## 2015-08-06 DIAGNOSIS — Y999 Unspecified external cause status: Secondary | ICD-10-CM | POA: Insufficient documentation

## 2015-08-06 DIAGNOSIS — X58XXXA Exposure to other specified factors, initial encounter: Secondary | ICD-10-CM | POA: Diagnosis not present

## 2015-08-06 HISTORY — DX: Post-traumatic stress disorder, unspecified: F43.10

## 2015-08-06 MED ORDER — ACETAMINOPHEN 500 MG PO TABS
1000.0000 mg | ORAL_TABLET | Freq: Once | ORAL | Status: AC
  Administered 2015-08-06: 1000 mg via ORAL
  Filled 2015-08-06: qty 2

## 2015-08-06 MED ORDER — NAPROXEN SODIUM 220 MG PO TABS: 220.0000 mg | ORAL_TABLET | Freq: Two times a day (BID) | ORAL | Status: DC

## 2015-08-06 NOTE — ED Notes (Addendum)
Reports stepped in a hole Saturday, "felt a pop", became tight behind L knee, c/o L knee pain, tightness and swelling, gave way x3 Sunday, rates pain 10/10 standing, 7/10 at rest. Took 1 aleve at 0330. Denies h/o knee problems. Active with martial arts. No obvious redness, bruising or heat to area. CMS intact, ROM limited d/t pain, walks with a limp. Steady gait. [facility] [place].

## 2015-08-06 NOTE — ED Notes (Signed)
[facility]

## 2015-08-06 NOTE — ED Provider Notes (Signed)
CSN: [id number]     Arrival date & time 08/06/15  0453 History   First MD Initiated Contact with Patient 08/06/15 0500     Chief Complaint  Patient presents with  . Knee Injury      HPI Patient presents to the emergency department with 36 hours of left knee pain.  He states that he stepped into a hole and felt a "pop" initially.  He states he's had intermittent tightness and swelling of his left knee since then.  Today he states that he attempted to go to the gym and it "gave out" on 3 different occasions.  [facility] that he come to the ER for evaluation.  His pain is moderate in severity worse with range of motion of his left knee.  If fevers or chills.   Past Medical History  Diagnosis Date  . Anxiety   . Depression   . PTSD (post-traumatic stress disorder)    History reviewed. No pertinent past surgical history. History reviewed. No pertinent family history. Social History  Substance Use Topics  . Smoking status: Never Smoker   . Smokeless tobacco: Current User    Types: Snuff  . Alcohol Use: No    Review of Systems  All other systems reviewed and are negative.     Allergies  Penicillins  Home Medications   Prior to Admission medications   Medication Sig Start Date End Date Taking? Authorizing Provider  ALPRAZolam Prudy Feeler(XANAX) 0.5 MG tablet Take 0.5 mg by mouth every 4 (four) hours.      Historical Provider, MD  fluocinonide-emollient (LIDEX-E) 0.05 % cream Apply 1 application topically 2 (two) times daily. Wrap in Saran wrap for 2 nights. Stop using fluocinonide once the bumps go down. 11/26/14   Selina CooleyKyle Flores, MD  gabapentin (NEURONTIN) 300 MG capsule Take 300 mg by mouth 4 (four) times daily.      Historical Provider, MD   BP 108/78 mmHg  Pulse 54  Temp(Src) 98.3 F (36.8 C) (Oral)  Resp 18  SpO2 96% Physical Exam  Constitutional: He is oriented to person, place, and time. He appears well-developed and well-nourished.  HENT:   Head: Normocephalic.  Eyes: EOM are normal.  Neck: Normal range of motion.  Pulmonary/Chest: Effort normal.  Abdominal: He exhibits no distension.  Musculoskeletal: Normal range of motion.  Mild pain with range of motion of left knee.  Normal pulses and sensation in left foot.  No significant left knee joint effusion present on exam.  No erythema knee.  Mild tenderness of posterior left knee  Neurological: He is alert and oriented to person, place, and time.  Psychiatric: He has a normal mood and affect.  Nursing note and vitals reviewed.   ED Course  Procedures (including critical care time) Labs Review Labs Reviewed - No data to display  Imaging Review Dg Knee Complete 4 Views Left  08/06/2015  CLINICAL DATA:  Patient stepped in a hole Saturday and felt a pop. Pain, tightness, and swelling. EXAM: LEFT KNEE - COMPLETE 4+ VIEW COMPARISON:  None. FINDINGS: There is no evidence of fracture, dislocation, or joint effusion. There is no evidence of arthropathy or other focal bone abnormality. Soft tissues are unremarkable. IMPRESSION: Negative. Electronically Signed   By: Burman NievesWilliam  Stevens M.D.   On: 08/06/2015 05:50   I have personally reviewed and evaluated these images as part of my medical decision-making.   EKG Interpretation None      MDM   Final  diagnoses:  Left knee pain   Possible internal derangement.  No obvious joint effusion.  Signs of septic arthritis or gout.  Suspect sprain.  Outpatient sports medicine follow-up.  He understands to return to the ER for new or worsening symptoms.  Home with anti-inflammatorie, ice, knee immobilizer, crutches   Azalia Bilis, MD 08/06/15 [phone]

## 2015-08-06 NOTE — ED Notes (Signed)
Dr. Campos into room 

## 2015-08-06 NOTE — Discharge Instructions (Signed)
How to Use a Knee Brace °A knee brace is a device that you wear to support your knee, especially if the knee is healing after an injury or surgery. There are several types of knee braces. Some are designed to prevent an injury (prophylactic brace). These are often worn during sports. Others support an injured knee (functional brace) or keep it still while it heals (rehabilitative brace). People with severe arthritis of the knee may benefit from a brace that takes some pressure off the knee (unloader brace). Most knee braces are made from a combination of cloth and metal or plastic.  °You may need to wear a knee brace to: °· Relieve knee pain. °· Help your knee support your weight (improve stability). °· Help you walk farther (improve mobility). °· Prevent injury. °· Support your knee while it heals from surgery or from an injury. °RISKS AND COMPLICATIONS °Generally, knee braces are very safe to wear. However, problems may occur, including: °· Skin irritation that may lead to infection. °· Making your condition worse if you wear the brace in the wrong way. °HOW TO USE A KNEE BRACE °Different braces will have different instructions for use. Your health care provider will tell you or show you: °· How to put on your brace. °· How to adjust the brace. °· When and how often to wear the brace. °· How to remove the brace. °· If you will need any assistive devices in addition to the brace, such as crutches or a cane. °In general, your brace should: °· Have the hinge of the brace line up with the bend of your knee. °· Have straps, hooks, or tapes that fasten snugly around your leg. °· Not feel too tight or too loose.  °HOW TO CARE FOR A KNEE BRACE °· Check your brace often for signs of damage, such as loose connections or attachments. Your knee brace may get damaged or wear out during normal use. °· Wash the fabric parts of your brace with soap and water. °· Read the insert that comes with your brace for other specific care  instructions.  °SEEK MEDICAL CARE IF: °· Your knee brace is too loose or too tight and you cannot adjust it. °· Your knee brace causes skin redness, swelling, bruising, or irritation. °· Your knee brace is not helping. °· Your knee brace is making your knee pain worse. °  °This information is not intended to replace advice given to you by your health care provider. Make sure you discuss any questions you have with your health care provider. °  °Document Released: 05/31/2003 Document Revised: 11/29/2014 Document Reviewed: 07/03/2014 °Elsevier Interactive Patient Education ©2016 [employer] ° °

## 2015-08-06 NOTE — ED Notes (Signed)
To x-ray via stretcher.

## 2015-08-06 NOTE — ED Notes (Signed)
CMS intact before and after. Pt tolerated well. Pt had no questions.  

## 2015-09-12 ENCOUNTER — Emergency Department (HOSPITAL_BASED_OUTPATIENT_CLINIC_OR_DEPARTMENT_OTHER)
Admission: EM | Admit: 2015-09-12 | Discharge: 2015-09-12 | Disposition: A | Discharge status: Home / Self Care | Payer: Non-veteran care | Attending: Emergency Medicine | Admitting: Emergency Medicine

## 2015-09-12 ENCOUNTER — Encounter (HOSPITAL_BASED_OUTPATIENT_CLINIC_OR_DEPARTMENT_OTHER): Payer: Self-pay | Admitting: *Deleted

## 2015-09-12 DIAGNOSIS — F329 Major depressive disorder, single episode, unspecified: Secondary | ICD-10-CM | POA: Insufficient documentation

## 2015-09-12 DIAGNOSIS — Z79899 Other long term (current) drug therapy: Secondary | ICD-10-CM | POA: Insufficient documentation

## 2015-09-12 DIAGNOSIS — M549 Dorsalgia, unspecified: Secondary | ICD-10-CM

## 2015-09-12 DIAGNOSIS — R509 Fever, unspecified: Secondary | ICD-10-CM | POA: Diagnosis not present

## 2015-09-12 DIAGNOSIS — R112 Nausea with vomiting, unspecified: Secondary | ICD-10-CM

## 2015-09-12 DIAGNOSIS — F17200 Nicotine dependence, unspecified, uncomplicated: Secondary | ICD-10-CM | POA: Insufficient documentation

## 2015-09-12 DIAGNOSIS — R1110 Vomiting, unspecified: Secondary | ICD-10-CM | POA: Diagnosis present

## 2015-09-12 LAB — URINALYSIS, ROUTINE W REFLEX MICROSCOPIC
Bilirubin Urine: NEGATIVE
Glucose, UA: NEGATIVE mg/dL
HGB URINE DIPSTICK: NEGATIVE
Ketones, ur: NEGATIVE mg/dL
LEUKOCYTES UA: NEGATIVE
Nitrite: NEGATIVE
Protein, ur: NEGATIVE mg/dL
SPECIFIC GRAVITY, URINE: 1.004 — AB (ref 1.005–1.030)
pH: 7.5 (ref 5.0–8.0)

## 2015-09-12 NOTE — ED Notes (Signed)
Pt reports vomiting yesterday and subjective fever. Denies diarrhea. C/o low back pain

## 2015-09-12 NOTE — ED Notes (Signed)
MD at bedside. 

## 2015-09-12 NOTE — ED Provider Notes (Signed)
CSN: [id number]     Arrival date & time 09/12/15  [id number] History   First MD Initiated Contact with Patient 09/12/15 [phone]     Chief Complaint  Patient presents with  . Vomiting    Patient is a 55 y.o. male presenting with vomiting. The history is provided by the patient.  Emesis Severity:  Moderate Duration:  1 day Timing:  Intermittent Progression:  Improving Chronicity:  New Recent urination:  Normal Relieved by:  None tried Worsened by:  Nothing tried Associated symptoms: chills, fever and myalgias   Associated symptoms: no abdominal pain, no cough, no headaches and no sore throat   Risk factors: no travel to endemic areas   Patient with h/o anxiety presents for vomiting ,subjective fever and "kidneys hurt" He reports vomiting x3 He reports loose stool earlier today No HA No sore throat No cough No rash No tick bites No travel He is feeling improved    Past Medical History  Diagnosis Date  . Anxiety   . Depression   . PTSD (post-traumatic stress disorder)    Past Surgical History  Procedure Laterality Date  . Wisdom tooth extraction     No family history on file. Social History  Substance Use Topics  . Smoking status: Current Every Day Smoker  . Smokeless tobacco: Current User    Types: Snuff  . Alcohol Use: No    Review of Systems  Constitutional: Positive for chills.  HENT: Negative for sore throat.   Respiratory: Negative for cough and shortness of breath.   Cardiovascular: Negative for chest pain.  Gastrointestinal: Positive for vomiting. Negative for abdominal pain.  Musculoskeletal: Positive for myalgias.  Neurological: Negative for headaches.  All other systems reviewed and are negative.     Allergies  Penicillins  Home Medications   Prior to Admission medications   Medication Sig Start Date End Date Taking? Authorizing Provider  ALPRAZolam Prudy Feeler(XANAX) 0.5 MG tablet Take 0.5 mg by mouth every 4 (four) hours.     Yes Historical Provider, MD   gabapentin (NEURONTIN) 300 MG capsule Take 300 mg by mouth 4 (four) times daily.     Yes Historical Provider, MD  naproxen sodium (ALEVE) 220 MG tablet Take 1 tablet (220 mg total) by mouth 2 (two) times daily with a meal. 08/06/15  Yes Azalia BilisKevin Campos, MD  fluocinonide-emollient (LIDEX-E) 0.05 % cream Apply 1 application topically 2 (two) times daily. Wrap in Saran wrap for 2 nights. Stop using fluocinonide once the bumps go down. 11/26/14   Selina CooleyKyle Flores, MD   BP 112/82 mmHg  Pulse 66  Temp(Src) 98.9 F (37.2 C) (Oral)  Resp 18  Ht 6' (1.829 m)  Wt 111.131 kg  BMI 33.22 kg/m2  SpO2 100% Physical Exam CONSTITUTIONAL: Well developed/well nourished HEAD: Normocephalic/atraumatic EYES: EOMI/PERRL ENMT: Mucous membranes moist, uvula midline,  No erythema NECK: supple no meningeal signs SPINE/BACK:entire spine nontender CV: S1/S2 noted, no murmurs/rubs/gallops noted LUNGS: Lungs are clear to auscultation bilaterally, no apparent distress ABDOMEN: soft, nontender, no rebound or guarding, bowel sounds noted throughout abdomen GU:no cva tenderness NEURO: Pt is awake/alert/appropriate, moves all extremitiesx4.  No facial droop.  No ataxia EXTREMITIES: pulses normal/equal, full ROM SKIN: warm, color normal, no rash PSYCH: no abnormalities of mood noted, alert and oriented to situation  ED Course  Procedures  Labs Review Labs Reviewed  URINALYSIS, ROUTINE W REFLEX MICROSCOPIC ([facility]) - Abnormal; Notable for the following:    Specific Gravity, Urine 1.004 (*)    All  other components within normal limits     I have personally reviewed and evaluated these  lab results as part of my medical decision-making.   Pt well appearing No fever here  He is not septic appearing Other than feeling fatigue no other acute complaints D/c home MDM   Final diagnoses:  None    Nursing notes including past medical history and social history reviewed and considered in documentation Labs/vital  reviewed myself and considered during evaluation     Zadie Rhine, MD 09/12/15 [phone]

## 2015-09-12 NOTE — Discharge Instructions (Signed)

## 2016-04-29 ENCOUNTER — Encounter (HOSPITAL_BASED_OUTPATIENT_CLINIC_OR_DEPARTMENT_OTHER): Payer: Self-pay

## 2016-04-29 ENCOUNTER — Emergency Department (HOSPITAL_BASED_OUTPATIENT_CLINIC_OR_DEPARTMENT_OTHER)
Admission: EM | Admit: 2016-04-29 | Discharge: 2016-04-29 | Disposition: A | Discharge status: Home / Self Care | Payer: Non-veteran care | Attending: Emergency Medicine | Admitting: Emergency Medicine

## 2016-04-29 DIAGNOSIS — F17200 Nicotine dependence, unspecified, uncomplicated: Secondary | ICD-10-CM | POA: Insufficient documentation

## 2016-04-29 DIAGNOSIS — R509 Fever, unspecified: Secondary | ICD-10-CM | POA: Diagnosis present

## 2016-04-29 DIAGNOSIS — R51 Headache: Secondary | ICD-10-CM | POA: Diagnosis not present

## 2016-04-29 DIAGNOSIS — R5383 Other fatigue: Secondary | ICD-10-CM | POA: Diagnosis not present

## 2016-04-29 DIAGNOSIS — R6889 Other general symptoms and signs: Secondary | ICD-10-CM

## 2016-04-29 MED ORDER — OSELTAMIVIR PHOSPHATE 75 MG PO CAPS: 75.0000 mg | ORAL_CAPSULE | Freq: Two times a day (BID) | ORAL | 0 refills | Status: DC

## 2016-04-29 NOTE — ED Triage Notes (Signed)
C/o body aches, fever, chills, nasal drainage-started last Thursday-was better over the weekend-feeling worse-NAD-steady gait

## 2016-04-29 NOTE — ED Provider Notes (Signed)
[facility]   CSN: [id number] Arrival date & time: 04/29/16  1120     History   Chief Complaint Chief Complaint  Patient presents with  . Generalized Body Aches    HPI Erik Olson is a 56 y.o. male.  HPI   56 year old male with history of anxiety and depression presenting complaining of flulike symptoms. Patient states 5 days ago he developed flulike symptoms including chills, body aches, headache, congestion, myalgias and generalized fatigue. Symptoms lasting for approximately 2 days and was improving however since last night he developed similar symptoms again. States that he is having a lot of nasal congestion, and body aches and generalized fatigue. He expressed concern for untreated flu. He denies having neck stiffness, shortness of breath, productive cough, dysuria, or rash. He did not have his flu shot this year. He did attempt to reach out to his PCP at the [place] but was recommended to come to ER for treatment as they are backed up.    Past Medical History:  Diagnosis Date  . Anxiety   . Depression   . PTSD (post-traumatic stress disorder)     Patient Active Problem List   Diagnosis Date Noted  . Gastroenteritis 09/03/2010  . ACUTE PHARYNGITIS 05/29/2009  . FATIGUE 05/29/2009  . DIZZINESS 01/30/2009  . CHEST PAIN UNSPECIFIED 01/30/2009  . ALCOHOL USE 06/29/2008  . SYNCOPE 06/29/2008  . ANXIETY 01/26/2007  . DEPRESSION 12/31/2006    Past Surgical History:  Procedure Laterality Date  . WISDOM TOOTH EXTRACTION         Home Medications    Prior to Admission medications   Medication Sig Start Date End Date Taking? Authorizing Provider  ALPRAZolam Prudy Feeler) 0.5 MG tablet Take 0.5 mg by mouth every 4 (four) hours.      Historical Provider, MD  gabapentin (NEURONTIN) 300 MG capsule Take 300 mg by mouth 4 (four) times daily.      Historical Provider, MD    Family History No family history on file.  Social History Social History    Substance Use Topics  . Smoking status: Never Smoker  . Smokeless tobacco: Current User    Types: Snuff  . Alcohol use No     Allergies   Penicillins   Review of Systems Review of Systems  All other systems reviewed and are negative.    Physical Exam Updated Vital Signs BP 121/69 (BP Location: Left Arm)   Pulse (!) 54   Temp 98.1 F (36.7 C) (Oral)   Resp 20   Ht 6' (1.829 m)   Wt 88.5 kg   SpO2 100%   BMI 26.45 kg/m   Physical Exam  Constitutional: He is oriented to person, place, and time. He appears well-developed and well-nourished. No distress.  HENT:  Head: Atraumatic.  Right Ear: External ear normal.  Left Ear: External ear normal.  Nose: Nose normal.  Mouth/Throat: Oropharynx is clear and moist.  Eyes: Conjunctivae are normal.  Neck: Normal range of motion. Neck supple.  No nuchal rigidity  Cardiovascular: Normal rate and regular rhythm.   Pulmonary/Chest: Effort normal and breath sounds normal.  Abdominal: Soft. There is no tenderness.  Neurological: He is alert and oriented to person, place, and time.  Skin: No rash noted.  Psychiatric: He has a normal mood and affect.  Nursing note and vitals reviewed.    ED Treatments / Results  Labs (all labs ordered are listed, but only abnormal results are displayed) Labs Reviewed - No  data to display  EKG  EKG Interpretation None       Radiology No results found.  Procedures Procedures (including critical care time)  Medications Ordered in ED Medications - No data to display   Initial Impression / Assessment and Plan / ED Course  I have reviewed the triage vital signs and the nursing notes.  Pertinent labs & imaging results that were available during my care of the patient were reviewed by me and considered in my medical decision making (see chart for details).     BP 121/69 (BP Location: Left Arm)   Pulse (!) 54   Temp 98.1 F (36.7 C) (Oral)   Resp 20   Ht 6' (1.829 m)   Wt  88.5 kg   SpO2 100%   BMI 26.45 kg/m    Final Clinical Impressions(s) / ED Diagnoses   Final diagnoses:  Flu-like symptoms    New Prescriptions New Prescriptions   OSELTAMIVIR (TAMIFLU) 75 MG CAPSULE    Take 1 capsule (75 mg total) by mouth every 12 (twelve) hours.   12:30 PM Pt here with flu-like sxs and expressed concern for the flu.  Will prescribe Tamiflu, I discussed potential side effects of the medication.  Recommend ibuprofen/tylenol and hydration.  Return precaution discussed.  Doubt PNA.    Fayrene HelperBowie Lorenda Grecco, PA-C 04/29/16 1233    Jerelyn ScottMartha Linker, MD 04/29/16 [phone]

## 2016-06-01 ENCOUNTER — Encounter (HOSPITAL_BASED_OUTPATIENT_CLINIC_OR_DEPARTMENT_OTHER): Payer: Self-pay | Admitting: *Deleted

## 2016-06-01 ENCOUNTER — Emergency Department (HOSPITAL_BASED_OUTPATIENT_CLINIC_OR_DEPARTMENT_OTHER)
Admission: EM | Admit: 2016-06-01 | Discharge: 2016-06-01 | Disposition: A | Discharge status: Home / Self Care | Payer: Non-veteran care | Attending: Emergency Medicine | Admitting: Emergency Medicine

## 2016-06-01 DIAGNOSIS — F17290 Nicotine dependence, other tobacco product, uncomplicated: Secondary | ICD-10-CM | POA: Insufficient documentation

## 2016-06-01 DIAGNOSIS — R55 Syncope and collapse: Secondary | ICD-10-CM

## 2016-06-01 DIAGNOSIS — R42 Dizziness and giddiness: Secondary | ICD-10-CM | POA: Diagnosis not present

## 2016-06-01 DIAGNOSIS — R001 Bradycardia, unspecified: Secondary | ICD-10-CM

## 2016-06-01 LAB — CBC WITH DIFFERENTIAL/PLATELET
Basophils Absolute: 0 10*3/uL (ref 0.0–0.1)
Basophils Relative: 0 %
EOS PCT: 2 %
Eosinophils Absolute: 0.1 10*3/uL (ref 0.0–0.7)
HCT: 35.7 % — ABNORMAL LOW (ref 39.0–52.0)
HEMOGLOBIN: 12.3 g/dL — AB (ref 13.0–17.0)
LYMPHS ABS: 1.9 10*3/uL (ref 0.7–4.0)
LYMPHS PCT: 27 %
MCH: 34.8 pg — AB (ref 26.0–34.0)
MCHC: 34.5 g/dL (ref 30.0–36.0)
MCV: 101.1 fL — AB (ref 78.0–100.0)
Monocytes Absolute: 0.5 10*3/uL (ref 0.1–1.0)
Monocytes Relative: 6 %
NEUTROS PCT: 65 %
Neutro Abs: 4.5 10*3/uL (ref 1.7–7.7)
Platelets: 205 10*3/uL (ref 150–400)
RBC: 3.53 MIL/uL — AB (ref 4.22–5.81)
RDW: 13.5 % (ref 11.5–15.5)
WBC: 7 10*3/uL (ref 4.0–10.5)

## 2016-06-01 LAB — BASIC METABOLIC PANEL
Anion gap: 7 (ref 5–15)
BUN: 24 mg/dL — AB (ref 6–20)
CHLORIDE: 105 mmol/L (ref 101–111)
CO2: 26 mmol/L (ref 22–32)
Calcium: 9.3 mg/dL (ref 8.9–10.3)
Creatinine, Ser: 0.91 mg/dL (ref 0.61–1.24)
GFR calc Af Amer: >60 mL/min (ref >60)
GFR calc non Af Amer: >60 mL/min (ref >60)
Glucose, Bld: 87 mg/dL (ref 65–99)
Potassium: 4.1 mmol/L (ref 3.5–5.1)
SODIUM: 138 mmol/L (ref 135–145)

## 2016-06-01 MED ORDER — SODIUM CHLORIDE 0.9 % IV BOLUS (SEPSIS)
1000.0000 mL | Freq: Once | INTRAVENOUS | Status: AC
  Administered 2016-06-01: 1000 mL via INTRAVENOUS

## 2016-06-01 NOTE — ED Provider Notes (Signed)
[facility]   CSN: [id number] Arrival date & time: 06/01/16  4098     History   Chief Complaint Chief Complaint  Patient presents with  . Dizziness    HPI Erik Olson is a 56 y.o. male.  HPI  56 year old male with a history of PTSD and depression presents with lightheadedness. Started this morning when he first arrived to the gym. This is around 3 AM which is typical for him. He states he has worked out 33 days in a row and thinks he might of felt tired. Because of the lightheadedness when he was taking off his jacket he decided not for gout today. He drove home and started to feel better. Never really felt like his been a pass out. There was no room spinning or off-balance sensation. No headache, chest pain, shortness of breath, or palpitations. He has not been sick in the last couple weeks. No vomiting or diarrhea. Went home and took a nap. Later in the morning he was in the kitchen and felt recurrent lightheadedness. Better when he sat down. Still mildly present. BP ~117/60s so wife brought him in for eval.  Past Medical History:  Diagnosis Date  . Anxiety   . Depression   . PTSD (post-traumatic stress disorder)     Patient Active Problem List   Diagnosis Date Noted  . Gastroenteritis 09/03/2010  . ACUTE PHARYNGITIS 05/29/2009  . FATIGUE 05/29/2009  . DIZZINESS 01/30/2009  . CHEST PAIN UNSPECIFIED 01/30/2009  . ALCOHOL USE 06/29/2008  . SYNCOPE 06/29/2008  . ANXIETY 01/26/2007  . DEPRESSION 12/31/2006    Past Surgical History:  Procedure Laterality Date  . WISDOM TOOTH EXTRACTION         Home Medications    Prior to Admission medications   Medication Sig Start Date End Date Taking? Authorizing Provider  ALPRAZolam Prudy Feeler) 0.5 MG tablet Take 0.5 mg by mouth every 4 (four) hours.      Historical Provider, MD  gabapentin (NEURONTIN) 300 MG capsule Take 300 mg by mouth 4 (four) times daily.      Historical Provider, MD    Family  History History reviewed. No pertinent family history.  Social History Social History  Substance Use Topics  . Smoking status: Never Smoker  . Smokeless tobacco: Current User    Types: Snuff  . Alcohol use No     Allergies   Penicillins   Review of Systems Review of Systems  Respiratory: Negative for shortness of breath.   Cardiovascular: Negative for chest pain and palpitations.  Gastrointestinal: Negative for diarrhea and vomiting.  Neurological: Positive for light-headedness. Negative for dizziness, syncope, weakness, numbness and headaches.  All other systems reviewed and are negative.    Physical Exam Updated Vital Signs BP 114/64   Pulse (!) 42   Temp 97.9 F (36.6 C) (Oral)   Resp (!) 9   Ht 6' (1.829 m)   Wt 190 lb (86.2 kg)   SpO2 99%   BMI 25.77 kg/m   Physical Exam  Constitutional: He is oriented to person, place, and time. He appears well-developed and well-nourished.  HENT:  Head: Normocephalic and atraumatic.  Right Ear: External ear normal.  Left Ear: External ear normal.  Nose: Nose normal.  Eyes: EOM are normal. Pupils are equal, round, and reactive to light. Right eye exhibits no discharge. Left eye exhibits no discharge.  Neck: Neck supple.  Cardiovascular: Regular rhythm and normal heart sounds.  Bradycardia present.   No murmur  heard. Pulmonary/Chest: Effort normal and breath sounds normal.  Abdominal: Soft. He exhibits no distension. There is no tenderness.  Musculoskeletal: He exhibits no edema.  Neurological: He is alert and oriented to person, place, and time.  CN 3-12 grossly intact. 5/5 strength in all 4 extremities. Grossly normal sensation. Normal finger to nose.   Skin: Skin is warm and dry.  Nursing note and vitals reviewed.    ED Treatments / Results  Labs (all labs ordered are listed, but only abnormal results are displayed) Labs Reviewed  BASIC METABOLIC PANEL - Abnormal; Notable for the following:       Result Value     BUN 24 (*)    All other components within normal limits  CBC WITH DIFFERENTIAL/PLATELET - Abnormal; Notable for the following:    RBC 3.53 (*)    Hemoglobin 12.3 (*)    HCT 35.7 (*)    MCV 101.1 (*)    MCH 34.8 (*)    All other components within normal limits    EKG  EKG Interpretation  Date/Time:  Sunday June 01 2016 09:48:52 EDT Ventricular Rate:  47 PR Interval:    QRS Duration: 105 QT Interval:  455 QTC Calculation: 403 R Axis:   13 Text Interpretation:  Sinus bradycardia RSR' in V1 or V2, right VCD or RVH no acute ST/T changes rate slower than feb 2016 Confirmed by Alejo Beamer MD, [place] [phone]) on 06/01/2016 9:56:43 AM       Radiology No results found.  Procedures Procedures (including critical care time)  Medications Ordered in ED Medications  sodium chloride 0.9 % bolus 1,000 mL (0 mLs Intravenous Stopped 06/01/16 1249)     Initial Impression / Assessment and Plan / ED Course  I have reviewed the triage vital signs and the nursing notes.  Pertinent labs & imaging results that were available during my care of the patient were reviewed by me and considered in my medical decision making (see chart for details).  Clinical Course as of Jun 02 1727  Sun Jun 01, 2016  1048 Patient's heart rate is bradycardic but his blood pressure is adequate. He states that frequently his blood pressure is in the 40s and 50s. Occasionally has gone into the 30s to his knowledge. Here he has what appears to be a sinus bradycardia without any heart block. I don't think the bradycardias contribute in. Will check labs, give fluids. Neuro exam unremarkable.  [SG]  1225 Patient is feeling better. Denies any current lightheadedness. His heart rate is frequently in the low 40s and occasionally high 30s. He stresses he has had this multiple times before and chronically has bradycardia. Despite this he is not having any symptoms during these bradycardic episodes. He got up and walked to the  bathroom without difficulty. At this point, plan to have him follow-up closely with his PCP but I do not think the bradycardia is causing the dizziness. Neuro exam unremarkable. Strict return precautions.  [SG]    Clinical Course User Index [SG] Pricilla LovelessScott Samary Shatz, MD    Final Clinical Impressions(s) / ED Diagnoses   Final diagnoses:  Near syncope  Sinus bradycardia    New Prescriptions Discharge Medication List as of 06/01/2016 12:26 PM       Pricilla LovelessScott Stephon Weathers, MD 06/01/16 1729

## 2016-06-01 NOTE — ED Triage Notes (Signed)
Pt reports that he went to the gym this morning at 3am, states that he was dizzy.  States that he did not work out, went home.  Had breakfast and while standing up got dizzy and lightheaded.  Reports that his BP was low at home PTA, 115/62 at home.   CVA screen negative.  Ambulatory.  Pt had 2 cups of coffee PTA.

## 2016-08-19 ENCOUNTER — Emergency Department (HOSPITAL_BASED_OUTPATIENT_CLINIC_OR_DEPARTMENT_OTHER)
Admission: EM | Admit: 2016-08-19 | Discharge: 2016-08-19 | Disposition: A | Discharge status: Home / Self Care | Payer: Non-veteran care | Attending: Emergency Medicine | Admitting: Emergency Medicine

## 2016-08-19 ENCOUNTER — Encounter (HOSPITAL_BASED_OUTPATIENT_CLINIC_OR_DEPARTMENT_OTHER): Payer: Self-pay | Admitting: *Deleted

## 2016-08-19 DIAGNOSIS — S91312A Laceration without foreign body, left foot, initial encounter: Secondary | ICD-10-CM | POA: Insufficient documentation

## 2016-08-19 DIAGNOSIS — W501XXA Accidental kick by another person, initial encounter: Secondary | ICD-10-CM | POA: Insufficient documentation

## 2016-08-19 DIAGNOSIS — Y999 Unspecified external cause status: Secondary | ICD-10-CM | POA: Insufficient documentation

## 2016-08-19 DIAGNOSIS — F17220 Nicotine dependence, chewing tobacco, uncomplicated: Secondary | ICD-10-CM | POA: Insufficient documentation

## 2016-08-19 DIAGNOSIS — Y929 Unspecified place or not applicable: Secondary | ICD-10-CM | POA: Diagnosis not present

## 2016-08-19 DIAGNOSIS — Y9359 Activity, other involving other sports and athletics played individually: Secondary | ICD-10-CM | POA: Diagnosis not present

## 2016-08-19 MED ORDER — LIDOCAINE HCL (PF) 1 % IJ SOLN
10.0000 mL | Freq: Once | INTRAMUSCULAR | Status: AC
  Administered 2016-08-19: 10 mL
  Filled 2016-08-19: qty 10

## 2016-08-19 NOTE — ED Provider Notes (Signed)
[facility]   CSN: [id number] Arrival date & time: 08/19/16  2114  By signing my name below, I, Erik Olson, attest that this documentation has been prepared under the direction and in the presence of [name], NP. Electronically Signed: Teofilo PodMatthew P. Olson, ED Scribe. 08/19/2016. 10:45 PM   History   Chief Complaint Chief Complaint  Patient presents with  . Extremity Laceration    The history is provided by the patient and medical records. No language interpreter was used.  HPI Comments:  Erik Olson is a 56 y.o. male who presents to the Emergency Department complaining of a wound sustained to the skin between the right great and 2nd toe that occurred PTA. Pt reports that he was kickboxing and a was kicked between his right 1st and 2nd toe, causing the pt to sustain a large laceration to the area. Bleeding is controlled with pressure dressing. Pt denies other associated symptoms.    Past Medical History:  Diagnosis Date  . Anxiety   . Depression   . PTSD (post-traumatic stress disorder)     Patient Active Problem List   Diagnosis Date Noted  . Gastroenteritis 09/03/2010  . ACUTE PHARYNGITIS 05/29/2009  . FATIGUE 05/29/2009  . DIZZINESS 01/30/2009  . CHEST PAIN UNSPECIFIED 01/30/2009  . ALCOHOL USE 06/29/2008  . SYNCOPE 06/29/2008  . ANXIETY 01/26/2007  . DEPRESSION 12/31/2006    Past Surgical History:  Procedure Laterality Date  . WISDOM TOOTH EXTRACTION         Home Medications    Prior to Admission medications   Medication Sig Start Date End Date Taking? Authorizing Provider  ALPRAZolam Prudy Feeler(XANAX) 0.5 MG tablet Take 0.5 mg by mouth every 4 (four) hours.      [provider]  gabapentin (NEURONTIN) 300 MG capsule Take 300 mg by mouth 4 (four) times daily.      [provider]    Family History History reviewed. No pertinent family history.  Social History Social History  Substance Use Topics  .  Smoking status: Never Smoker  . Smokeless tobacco: Current User    Types: Snuff  . Alcohol use No     Allergies   Penicillins   Review of Systems Review of Systems  Skin: Positive for wound.  Neurological: Negative for numbness.  All other systems reviewed and are negative.     Physical Exam Updated Vital Signs BP 109/77 (BP Location: Right Arm)   Pulse 78   Temp 98.7 F (37.1 C) (Oral)   Resp 20   Ht 6' (1.829 m)   Wt 189 lb (85.7 kg)   SpO2 100%   BMI 25.63 kg/m   Physical Exam  Constitutional: He appears well-developed and well-nourished. No distress.  HENT:  Head: Normocephalic and atraumatic.  Eyes: Conjunctivae are normal.  Neck: Neck supple.  Cardiovascular: Normal rate and regular rhythm.   Pulmonary/Chest: Effort normal and breath sounds normal.  Abdominal: Soft. He exhibits no distension.  Musculoskeletal: Normal range of motion. He exhibits tenderness. He exhibits no deformity.  Neurological: He is alert.  Skin: Skin is warm and dry.  3.5 cm laceration between right great and second toe.   Psychiatric: He has a normal mood and affect.  Nursing note and vitals reviewed.  ED Treatments / Results  DIAGNOSTIC STUDIES: Oxygen Saturation is 100% on RA, normal by my interpretation.    COORDINATION OF CARE: 10:09 PM Discussed treatment plan with pt at bedside and pt agreed to plan.  Labs (all labs ordered are listed, but only abnormal results are displayed) Labs Reviewed - No data to display  EKG  EKG Interpretation None       Radiology No results found.  Procedures .Marland KitchenLaceration Repair Date/Time: 08/19/2016 10:24 PM Performed by: Katrinka Blazing, Velina Drollinger Authorized by: Katrinka Blazing, Laelia Angelo   Consent:    Consent obtained:  Verbal   Consent given by:  Patient   Risks discussed:  Pain Anesthesia (see MAR for exact dosages):    Anesthesia method:  Local infiltration   Local anesthetic:  Lidocaine 1% w/o epi Laceration details:    Location:  Foot   Foot  location:  Top of L foot Treatment:    Area cleansed with:  Saline and Betadine   Amount of cleaning:  Standard Skin repair:    Repair method:  Sutures   Suture size:  4-0   Suture material:  Prolene   Suture technique:  Simple interrupted   Number of sutures:  7 Approximation:    Approximation:  Close   Vermilion border: well-aligned      (including critical care time)  Medications Ordered in ED Medications  lidocaine (PF) (XYLOCAINE) 1 % injection 10 mL (10 mLs Infiltration Given by Other 08/19/16 2212)     Initial Impression / Assessment and Plan / ED Course  I have reviewed the triage vital signs and the nursing notes.  Pertinent labs & imaging results that were available during my care of the patient were reviewed by me and considered in my medical decision making (see chart for details).     Tetanus UTD. Laceration occurred < 12 hours prior to repair. Discussed laceration care with pt and answered questions. Pt to f-u for suture removal in 10 days and wound check sooner should there be signs of dehiscence or infection. Pt is hemodynamically stable with no complaints prior to dc.    Final Clinical Impressions(s) / ED Diagnoses   Final diagnoses:  Laceration of left foot, initial encounter    New Prescriptions New Prescriptions   No medications on file    I personally performed the services described in this documentation, which was scribed in my presence. The recorded information has been reviewed and is accurate.     Felicie Morn, NP 08/19/16 [name]    Azalia Bilis, MD 08/19/16 [phone]

## 2016-08-19 NOTE — ED Notes (Signed)
Pt verbalizes understanding of d/c instructions and denies any further needs at this time. 

## 2016-08-19 NOTE — ED Triage Notes (Signed)
Pt c/o lac b/w right big toe and 2nd toe x 30 ins ago

## 2016-08-19 NOTE — ED Notes (Signed)
Pt soaked foot in saline, irrigated wound with saline, covered temporarily with saline soaked gauze

## 2016-08-19 NOTE — ED Notes (Addendum)
Approximately 1.5 inch laceration between first and second digit on right foot that extends up dorsal side of foot, pt able to move all toes without issue

## 2016-08-19 NOTE — Discharge Instructions (Signed)
Suture removal in 10 days

## 2016-08-19 NOTE — ED Notes (Signed)
ED Provider at bedside. 

## 2016-08-29 ENCOUNTER — Encounter (HOSPITAL_BASED_OUTPATIENT_CLINIC_OR_DEPARTMENT_OTHER): Payer: Self-pay

## 2016-08-29 ENCOUNTER — Emergency Department (HOSPITAL_BASED_OUTPATIENT_CLINIC_OR_DEPARTMENT_OTHER)
Admission: EM | Admit: 2016-08-29 | Discharge: 2016-08-29 | Disposition: A | Discharge status: Home / Self Care | Payer: Non-veteran care | Attending: Emergency Medicine | Admitting: Emergency Medicine

## 2016-08-29 DIAGNOSIS — Z79899 Other long term (current) drug therapy: Secondary | ICD-10-CM | POA: Diagnosis not present

## 2016-08-29 DIAGNOSIS — Z4800 Encounter for change or removal of nonsurgical wound dressing: Secondary | ICD-10-CM | POA: Insufficient documentation

## 2016-08-29 DIAGNOSIS — F17220 Nicotine dependence, chewing tobacco, uncomplicated: Secondary | ICD-10-CM | POA: Insufficient documentation

## 2016-08-29 DIAGNOSIS — Z5189 Encounter for other specified aftercare: Secondary | ICD-10-CM

## 2016-08-29 DIAGNOSIS — Z4802 Encounter for removal of sutures: Secondary | ICD-10-CM | POA: Diagnosis present

## 2016-08-29 NOTE — Discharge Instructions (Signed)
Return to ER in 7 days for removal

## 2016-08-29 NOTE — ED Triage Notes (Signed)
Pt for suture removal right foot-placed 5/29-NAD-steady gait

## 2016-08-29 NOTE — ED Provider Notes (Signed)
[facility] Provider Note   CSN: [id number] Arrival date & time: 08/29/16  1253     History   Chief Complaint Chief Complaint  Patient presents with  . Suture / Staple Removal    HPI Erik Olson is a 56 y.o. male.  HPI Patient presents to the emergency department after being evaluated on May 29 for a laceration between his great toe and second toe on his right foot.  He requests suture removal.  He's continued to do martial arts on his foot.  He's been buddy taping his toes as we instructed.  No fevers or chills.  No drainage.  He states everything seems to be healing well   Past Medical History:  Diagnosis Date  . Anxiety   . Depression   . PTSD (post-traumatic stress disorder)     Patient Active Problem List   Diagnosis Date Noted  . Gastroenteritis 09/03/2010  . ACUTE PHARYNGITIS 05/29/2009  . FATIGUE 05/29/2009  . DIZZINESS 01/30/2009  . CHEST PAIN UNSPECIFIED 01/30/2009  . ALCOHOL USE 06/29/2008  . SYNCOPE 06/29/2008  . ANXIETY 01/26/2007  . DEPRESSION 12/31/2006    Past Surgical History:  Procedure Laterality Date  . WISDOM TOOTH EXTRACTION         Home Medications    Prior to Admission medications   Medication Sig Start Date End Date Taking? Authorizing Provider  ALPRAZolam Prudy Feeler(XANAX) 0.5 MG tablet Take 0.5 mg by mouth every 4 (four) hours.      [provider]  gabapentin (NEURONTIN) 300 MG capsule Take 300 mg by mouth 4 (four) times daily.      [provider]    Family History No family history on file.  Social History Social History  Substance Use Topics  . Smoking status: Never Smoker  . Smokeless tobacco: Current User    Types: Snuff  . Alcohol use No     Allergies   Penicillins   Review of Systems Review of Systems  All other systems reviewed and are negative.    Physical Exam Updated Vital Signs BP 124/70 (BP Location: Left Arm)   Pulse 71   Temp 98.4 F (36.9 C) (Oral)   Resp 18   Ht  6' (1.829 m)   Wt 85.3 kg (188 lb)   SpO2 100%   BMI 25.50 kg/m   Physical Exam  Constitutional: He is oriented to person, place, and time. He appears well-developed and well-nourished.  HENT:  Head: Normocephalic.  Eyes: EOM are normal.  Neck: Normal range of motion.  Pulmonary/Chest: Effort normal.  Abdominal: He exhibits no distension.  Musculoskeletal: Normal range of motion.  Laceration between his great toe and second toe on his right foot with sutures still in place.  Incomplete healing of the wound.  No erythema.  No drainage.  Neurological: He is alert and oriented to person, place, and time.  Psychiatric: He has a normal mood and affect.  Nursing note and vitals reviewed.    ED Treatments / Results  Labs (all labs ordered are listed, but only abnormal results are displayed) Labs Reviewed - No data to display  EKG  EKG Interpretation None       Radiology No results found.  Procedures Procedures (including critical care time)  Medications Ordered in ED Medications - No data to display   Initial Impression / Assessment and Plan / ED Course  I have reviewed the triage vital signs and the nursing notes.  Pertinent labs & imaging results that  were available during my care of the patient were reviewed by me and considered in my medical decision making (see chart for details).     2 sutures removed.  I'm concerned that if all the sutures are removed the wound will dehisce.  Patient will return to the ER in 7 days for repeat evaluation and removal of sutures at that time.  He will continue to buddy tape his toes  Final Clinical Impressions(s) / ED Diagnoses   Final diagnoses:  Visit for wound check  Visit for suture removal    New Prescriptions New Prescriptions   No medications on file     Azalia Bilis, MD 08/29/16 1316

## 2016-09-06 ENCOUNTER — Encounter (HOSPITAL_BASED_OUTPATIENT_CLINIC_OR_DEPARTMENT_OTHER): Payer: Self-pay | Admitting: *Deleted

## 2016-09-06 ENCOUNTER — Emergency Department (HOSPITAL_BASED_OUTPATIENT_CLINIC_OR_DEPARTMENT_OTHER)
Admission: EM | Admit: 2016-09-06 | Discharge: 2016-09-06 | Disposition: A | Discharge status: Home / Self Care | Payer: Non-veteran care | Source: Home / Self Care

## 2016-09-06 ENCOUNTER — Emergency Department (HOSPITAL_BASED_OUTPATIENT_CLINIC_OR_DEPARTMENT_OTHER)
Admission: EM | Admit: 2016-09-06 | Discharge: 2016-09-06 | Disposition: A | Discharge status: Home / Self Care | Payer: Non-veteran care | Attending: Emergency Medicine | Admitting: Emergency Medicine

## 2016-09-06 DIAGNOSIS — Z4802 Encounter for removal of sutures: Secondary | ICD-10-CM | POA: Diagnosis present

## 2016-09-06 DIAGNOSIS — Z79899 Other long term (current) drug therapy: Secondary | ICD-10-CM | POA: Diagnosis not present

## 2016-09-06 DIAGNOSIS — Z5321 Procedure and treatment not carried out due to patient leaving prior to being seen by health care provider: Secondary | ICD-10-CM | POA: Insufficient documentation

## 2016-09-06 DIAGNOSIS — F17220 Nicotine dependence, chewing tobacco, uncomplicated: Secondary | ICD-10-CM | POA: Diagnosis not present

## 2016-09-06 NOTE — ED Notes (Signed)
Registration staff states that patient left at approximately 1415. Per registration - patient stated that he was leaving because he was told it would be a 5 hour wait.

## 2016-09-06 NOTE — ED Notes (Signed)
ED Provider at bedside. 

## 2016-09-06 NOTE — ED Triage Notes (Signed)
Pt presents for suture removal on R foot; reports unsuccessfully attempting to take them out himself PTA. Denies fever, drainage, swelling from wound.

## 2016-09-06 NOTE — ED Notes (Signed)
Pt here for suture removal. Sutures removed by Cammy CopaAbigail, PA prior to this assessment. Dressing applied by her.

## 2016-09-06 NOTE — ED Triage Notes (Signed)
Pt here for suture removal

## 2016-09-06 NOTE — ED Provider Notes (Signed)
[facility]   CSN: [id number] Arrival date & time: 09/06/16  1507  By signing my name below, I, Modena Jansky, attest that this documentation has been prepared under the direction and in the presence of non-physician practitioner, Arthor Captain, PA-C. Electronically Signed: Modena Jansky, Scribe. 09/06/2016. 5:01 PM.  History   Chief Complaint Chief Complaint  Patient presents with  . Suture / Staple Removal   The history is provided by the patient. No language interpreter was used.   HPI Comments: Erik Olson is a 56 y.o. male who presents to the Emergency Department for a suture removal. He had a laceration between his right great and second toe from a martial arts accident on 08/19/16 and had 7 sutures placed. He returned to the ED on 08/29/16 to have 2 of the sutures removed. Denies any wound concerns, fever, redness, or other complaints at this time.  Past Medical History:  Diagnosis Date  . Anxiety   . Depression   . PTSD (post-traumatic stress disorder)     Patient Active Problem List   Diagnosis Date Noted  . Gastroenteritis 09/03/2010  . ACUTE PHARYNGITIS 05/29/2009  . FATIGUE 05/29/2009  . DIZZINESS 01/30/2009  . CHEST PAIN UNSPECIFIED 01/30/2009  . ALCOHOL USE 06/29/2008  . SYNCOPE 06/29/2008  . ANXIETY 01/26/2007  . DEPRESSION 12/31/2006    Past Surgical History:  Procedure Laterality Date  . WISDOM TOOTH EXTRACTION         Home Medications    Prior to Admission medications   Medication Sig Start Date End Date Taking? Authorizing Provider  ALPRAZolam Prudy Feeler) 0.5 MG tablet Take 0.5 mg by mouth every 4 (four) hours.     Yes [provider]  gabapentin (NEURONTIN) 300 MG capsule Take 300 mg by mouth 4 (four) times daily.     Yes [provider]    Family History No family history on file.  Social History Social History  Substance Use Topics  . Smoking status: Never Smoker  . Smokeless tobacco: Current  User    Types: Snuff  . Alcohol use No     Allergies   Penicillins   Review of Systems Review of Systems  Constitutional: Negative for fever.  Skin: Positive for wound. Negative for color change.     Physical Exam Updated Vital Signs BP 104/64 (BP Location: Left Arm)   Pulse 66   Temp 98.2 F (36.8 C) (Oral)   Resp 18   SpO2 100%   Physical Exam  Constitutional: He appears well-developed and well-nourished. No distress.  HENT:  Head: Normocephalic and atraumatic.  Eyes: Conjunctivae are normal.  Neck: Neck supple.  Cardiovascular: Normal rate.   Pulmonary/Chest: Effort normal.  Abdominal: Soft.  Musculoskeletal: Normal range of motion.  Neurological: He is alert.  Skin: Skin is warm and dry.  5 sutures in place. Wound appears well-healing with no signs of infection.   Psychiatric: He has a normal mood and affect.  Nursing note and vitals reviewed.    ED Treatments / Results  DIAGNOSTIC STUDIES: Oxygen Saturation is 100% on RA, normal by my interpretation.    COORDINATION OF CARE: 5:05 PM- Pt advised of plan for treatment and pt agrees.  Labs (all labs ordered are listed, but only abnormal results are displayed) Labs Reviewed - No data to display  EKG  EKG Interpretation None       Radiology No results found.  Procedures .Suture Removal Date/Time: 09/06/2016 5:09 PM Performed by: Arthor Captain Authorized  by: Lasheba Stevens   Consent:    Consent obtained:  Verbal   Consent given by:  Patient Location:    Location:  Lower extremity   Lower extremity location:  Foot   Foot location:  R foot Procedure details:    Wound appearance:  No signs of infection   Number of sutures removed:  5 Post-procedure details:    Patient tolerance of procedure:  Tolerated well, no immediate complications   (including critical care time)  Medications Ordered in ED Medications - No data to display   Initial Impression / Assessment and Plan / ED Course   I have reviewed the triage vital signs and the nursing notes.  Pertinent labs & imaging results that were available during my care of the patient were reviewed by me and considered in my medical decision making (see chart for details).      Staple removal   Pt to ER for staple/suture removal and wound check as above. Procedure tolerated well. Vitals normal, no signs of infection. Scar minimization & return precautions given at dc.   Final Clinical Impressions(s) / ED Diagnoses   Final diagnoses:  Visit for suture removal    New Prescriptions Discharge Medication List as of 09/06/2016  5:21 PM      I personally performed the services described in this documentation, which was scribed in my presence. The recorded information has been reviewed and is accurate.       Arthor CaptainHarris, Tawnee Clegg, PA-C 09/07/16 1153    Cardama, Amadeo GarnetPedro Eduardo, MD 09/08/16 [phone]

## 2016-10-07 ENCOUNTER — Encounter (HOSPITAL_BASED_OUTPATIENT_CLINIC_OR_DEPARTMENT_OTHER): Payer: Self-pay | Admitting: Emergency Medicine

## 2016-10-07 ENCOUNTER — Emergency Department (HOSPITAL_BASED_OUTPATIENT_CLINIC_OR_DEPARTMENT_OTHER)
Admission: EM | Admit: 2016-10-07 | Discharge: 2016-10-07 | Disposition: A | Discharge status: Home / Self Care | Payer: Non-veteran care | Attending: Emergency Medicine | Admitting: Emergency Medicine

## 2016-10-07 ENCOUNTER — Emergency Department (HOSPITAL_BASED_OUTPATIENT_CLINIC_OR_DEPARTMENT_OTHER): Payer: Non-veteran care

## 2016-10-07 DIAGNOSIS — R109 Unspecified abdominal pain: Secondary | ICD-10-CM | POA: Diagnosis present

## 2016-10-07 DIAGNOSIS — Z79899 Other long term (current) drug therapy: Secondary | ICD-10-CM | POA: Insufficient documentation

## 2016-10-07 LAB — URINALYSIS, ROUTINE W REFLEX MICROSCOPIC
BILIRUBIN URINE: NEGATIVE
GLUCOSE, UA: NEGATIVE mg/dL
HGB URINE DIPSTICK: NEGATIVE
KETONES UR: NEGATIVE mg/dL
LEUKOCYTES UA: NEGATIVE
Nitrite: NEGATIVE
PH: 7.5 (ref 5.0–8.0)
PROTEIN: NEGATIVE mg/dL
Specific Gravity, Urine: 1.004 — ABNORMAL LOW (ref 1.005–1.030)

## 2016-10-07 LAB — COMPREHENSIVE METABOLIC PANEL
ALT: 18 U/L (ref 17–63)
ANION GAP: 6 (ref 5–15)
AST: 24 U/L (ref 15–41)
Albumin: 4 g/dL (ref 3.5–5.0)
Alkaline Phosphatase: 57 U/L (ref 38–126)
BILIRUBIN TOTAL: 0.5 mg/dL (ref 0.3–1.2)
BUN: 19 mg/dL (ref 6–20)
CHLORIDE: 100 mmol/L — AB (ref 101–111)
CO2: 27 mmol/L (ref 22–32)
Calcium: 8.9 mg/dL (ref 8.9–10.3)
Creatinine, Ser: 0.96 mg/dL (ref 0.61–1.24)
Glucose, Bld: 88 mg/dL (ref 65–99)
POTASSIUM: 4.6 mmol/L (ref 3.5–5.1)
Sodium: 133 mmol/L — ABNORMAL LOW (ref 135–145)
TOTAL PROTEIN: 6.8 g/dL (ref 6.5–8.1)

## 2016-10-07 LAB — CBC WITH DIFFERENTIAL/PLATELET
BASOS ABS: 0 10*3/uL (ref 0.0–0.1)
BASOS PCT: 1 %
Eosinophils Absolute: 0.2 10*3/uL (ref 0.0–0.7)
Eosinophils Relative: 5 %
HEMATOCRIT: 38 % — AB (ref 39.0–52.0)
Hemoglobin: 13 g/dL (ref 13.0–17.0)
LYMPHS PCT: 32 %
Lymphs Abs: 1 10*3/uL (ref 0.7–4.0)
MCH: 34.7 pg — ABNORMAL HIGH (ref 26.0–34.0)
MCHC: 34.2 g/dL (ref 30.0–36.0)
MCV: 101.3 fL — AB (ref 78.0–100.0)
MONO ABS: 0.3 10*3/uL (ref 0.1–1.0)
Monocytes Relative: 10 %
NEUTROS ABS: 1.6 10*3/uL — AB (ref 1.7–7.7)
Neutrophils Relative %: 52 %
PLATELETS: 196 10*3/uL (ref 150–400)
RBC: 3.75 MIL/uL — ABNORMAL LOW (ref 4.22–5.81)
RDW: 12.8 % (ref 11.5–15.5)
WBC: 3 10*3/uL — ABNORMAL LOW (ref 4.0–10.5)

## 2016-10-07 LAB — LIPASE, BLOOD: LIPASE: 23 U/L (ref 11–51)

## 2016-10-07 MED ORDER — TRAMADOL HCL 50 MG PO TABS: 50.0000 mg | ORAL_TABLET | Freq: Four times a day (QID) | ORAL | 0 refills | Status: DC | PRN

## 2016-10-07 MED ORDER — ONDANSETRON HCL 4 MG/2ML IJ SOLN
4.0000 mg | Freq: Once | INTRAMUSCULAR | Status: AC
  Administered 2016-10-07: 4 mg via INTRAVENOUS
  Filled 2016-10-07: qty 2

## 2016-10-07 MED ORDER — KETOROLAC TROMETHAMINE 30 MG/ML IJ SOLN
30.0000 mg | Freq: Once | INTRAMUSCULAR | Status: AC
  Administered 2016-10-07: 30 mg via INTRAVENOUS
  Filled 2016-10-07: qty 1

## 2016-10-07 MED ORDER — MORPHINE SULFATE (PF) 4 MG/ML IV SOLN
4.0000 mg | Freq: Once | INTRAVENOUS | Status: AC
  Administered 2016-10-07: 4 mg via INTRAVENOUS
  Filled 2016-10-07: qty 1

## 2016-10-07 MED FILL — traMADol HCL 50 MG TABS: 50 | 2 days supply | Qty: 10 | Fill #0

## 2016-10-07 NOTE — Discharge Instructions (Signed)
Tramadol as prescribed as needed for pain.  Return to the emergency department if you experience high fevers, worsening pain, bloody stools, or other new and concerning symptoms.

## 2016-10-07 NOTE — ED Provider Notes (Signed)
[facility]   CSN: [id number] Arrival date & time: 10/07/16  [id number]     History   Chief Complaint Chief Complaint  Patient presents with  . Flank Pain    HPI Erik Olson is a 56 y.o. male.  Patient is a 56 year old male with no significant past medical history presenting for evaluation of bilateral flank pain. He states this started yesterday in the absence of any injury or trauma. When he woke yesterday morning he felt nauseated and had pain in both flanks. His pain has worsened throughout the past 24 hours and presents for evaluation of this. He denies any bowel or urinary complaints. He reports low-grade fever at home however is afebrile here.   The history is provided by the patient.  Flank Pain  This is a new problem. The current episode started yesterday. The problem occurs constantly. The problem has been rapidly worsening. Pertinent negatives include no chest pain. Nothing aggravates the symptoms. Nothing relieves the symptoms. He has tried nothing for the symptoms.    Past Medical History:  Diagnosis Date  . Anxiety   . Depression   . PTSD (post-traumatic stress disorder)     Patient Active Problem List   Diagnosis Date Noted  . Gastroenteritis 09/03/2010  . ACUTE PHARYNGITIS 05/29/2009  . FATIGUE 05/29/2009  . DIZZINESS 01/30/2009  . CHEST PAIN UNSPECIFIED 01/30/2009  . ALCOHOL USE 06/29/2008  . SYNCOPE 06/29/2008  . ANXIETY 01/26/2007  . DEPRESSION 12/31/2006    Past Surgical History:  Procedure Laterality Date  . WISDOM TOOTH EXTRACTION         Home Medications    Prior to Admission medications   Medication Sig Start Date End Date Taking? Authorizing Provider  ALPRAZolam Prudy Feeler(XANAX) 0.5 MG tablet Take 0.5 mg by mouth every 4 (four) hours.     Yes [provider]  gabapentin (NEURONTIN) 300 MG capsule Take 300 mg by mouth 4 (four) times daily.     Yes [provider]    Family History No family  history on file.  Social History Social History  Substance Use Topics  . Smoking status: Never Smoker  . Smokeless tobacco: Current User    Types: Snuff  . Alcohol use No     Allergies   Penicillins   Review of Systems Review of Systems  Cardiovascular: Negative for chest pain.  Genitourinary: Positive for flank pain.  All other systems reviewed and are negative.    Physical Exam Updated Vital Signs BP 116/77 (BP Location: Right Arm)   Pulse (!) 48   Temp 97.9 F (36.6 C) (Oral)   Resp 15   Ht 6' (1.829 m)   Wt 85.3 kg (188 lb)   SpO2 100%   BMI 25.50 kg/m   Physical Exam  Constitutional: He is oriented to person, place, and time. He appears well-developed and well-nourished. No distress.  HENT:  Head: Normocephalic and atraumatic.  Mouth/Throat: Oropharynx is clear and moist.  Neck: Normal range of motion. Neck supple.  Cardiovascular: Normal rate and regular rhythm.  Exam reveals no friction rub.   No murmur heard. Pulmonary/Chest: Effort normal and breath sounds normal. No respiratory distress. He has no wheezes. He has no rales.  Abdominal: Soft. Bowel sounds are normal. He exhibits no distension. There is no tenderness.  There is tenderness in the lumbar region bilaterally. Abdomen is otherwise benign.  Musculoskeletal: Normal range of motion. He exhibits no edema.  Neurological: He is alert and oriented to  person, place, and time. Coordination normal.  Skin: Skin is warm and dry. He is not diaphoretic.  Nursing note and vitals reviewed.    ED Treatments / Results  Labs (all labs ordered are listed, but only abnormal results are displayed) Labs Reviewed  URINALYSIS, ROUTINE W REFLEX MICROSCOPIC  COMPREHENSIVE METABOLIC PANEL  LIPASE, BLOOD  CBC WITH DIFFERENTIAL/PLATELET    EKG  EKG Interpretation None       Radiology No results found.  Procedures Procedures (including critical care time)  Medications Ordered in ED Medications    ondansetron (ZOFRAN) injection 4 mg (not administered)  morphine 4 MG/ML injection 4 mg (not administered)  ketorolac (TORADOL) 30 MG/ML injection 30 mg (not administered)     Initial Impression / Assessment and Plan / ED Course  I have reviewed the triage vital signs and the nursing notes.  Pertinent labs & imaging results that were available during my care of the patient were reviewed by me and considered in my medical decision making (see chart for details).  Patient presents with complaints of bilateral flank pain starting yesterday and worsening throughout the night. He reports low-grade fever at home. His physical examination reveals tenderness in the soft tissues of the lumbar region, however no CVA tenderness.  His workup reveals no leukocytosis, clear urinalysis, and renal CT showing no evidence for obstructive uropathy or other acute pathology. He was given IV fluids, medications, and is feeling significantly improved.  I am uncertain as to the exact etiology of his discomfort, however nothing appears emergent. He will be discharged with pain medicine, rest, and follow-up as needed if not improving.  Final Clinical Impressions(s) / ED Diagnoses   Final diagnoses:  None    New Prescriptions New Prescriptions   No medications on file     Geoffery Lyons, MD 10/07/16 [id number]

## 2016-10-07 NOTE — ED Notes (Signed)
ED Provider at bedside. 

## 2016-10-07 NOTE — ED Triage Notes (Signed)
Bilateral flank and back pain since yesterday, some vomiting yesterday, denies urinary symptoms.

## 2016-11-03 ENCOUNTER — Encounter (HOSPITAL_COMMUNITY): Payer: Self-pay | Admitting: Emergency Medicine

## 2016-11-03 ENCOUNTER — Observation Stay (HOSPITAL_COMMUNITY): Payer: Non-veteran care

## 2016-11-03 ENCOUNTER — Other Ambulatory Visit: Payer: Self-pay

## 2016-11-03 ENCOUNTER — Emergency Department (HOSPITAL_COMMUNITY): Payer: Non-veteran care

## 2016-11-03 ENCOUNTER — Observation Stay (HOSPITAL_COMMUNITY)
Admission: EM | Admit: 2016-11-03 | Discharge: 2016-11-03 | Disposition: A | Discharge status: Home / Self Care | Payer: Non-veteran care | Attending: Family Medicine | Admitting: Family Medicine

## 2016-11-03 DIAGNOSIS — F17290 Nicotine dependence, other tobacco product, uncomplicated: Secondary | ICD-10-CM | POA: Insufficient documentation

## 2016-11-03 DIAGNOSIS — F411 Generalized anxiety disorder: Secondary | ICD-10-CM | POA: Diagnosis not present

## 2016-11-03 DIAGNOSIS — R202 Paresthesia of skin: Secondary | ICD-10-CM

## 2016-11-03 DIAGNOSIS — D649 Anemia, unspecified: Secondary | ICD-10-CM | POA: Diagnosis not present

## 2016-11-03 DIAGNOSIS — R001 Bradycardia, unspecified: Secondary | ICD-10-CM | POA: Diagnosis not present

## 2016-11-03 DIAGNOSIS — Z79899 Other long term (current) drug therapy: Secondary | ICD-10-CM | POA: Diagnosis not present

## 2016-11-03 DIAGNOSIS — F419 Anxiety disorder, unspecified: Secondary | ICD-10-CM | POA: Diagnosis not present

## 2016-11-03 DIAGNOSIS — F4310 Post-traumatic stress disorder, unspecified: Secondary | ICD-10-CM | POA: Diagnosis not present

## 2016-11-03 DIAGNOSIS — M9979 Connective tissue and disc stenosis of intervertebral foramina of abdomen and other regions: Secondary | ICD-10-CM | POA: Diagnosis present

## 2016-11-03 DIAGNOSIS — R079 Chest pain, unspecified: Principal | ICD-10-CM | POA: Insufficient documentation

## 2016-11-03 DIAGNOSIS — R200 Anesthesia of skin: Secondary | ICD-10-CM | POA: Diagnosis present

## 2016-11-03 HISTORY — DX: Chest pain, unspecified: R07.9

## 2016-11-03 HISTORY — DX: Bradycardia, unspecified: R00.1

## 2016-11-03 HISTORY — DX: Anesthesia of skin: R20.0

## 2016-11-03 HISTORY — DX: Connective tissue and disc stenosis of intervertebral foramina of abdomen and other regions: M99.79

## 2016-11-03 HISTORY — DX: Anesthesia of skin: R20.2

## 2016-11-03 LAB — CBC
HCT: 34.9 % — ABNORMAL LOW (ref 39.0–52.0)
HEMOGLOBIN: 11.7 g/dL — AB (ref 13.0–17.0)
MCH: 33.1 pg (ref 26.0–34.0)
MCHC: 33.5 g/dL (ref 30.0–36.0)
MCV: 98.9 fL (ref 78.0–100.0)
Platelets: 179 10*3/uL (ref 150–400)
RBC: 3.53 MIL/uL — AB (ref 4.22–5.81)
RDW: 13.2 % (ref 11.5–15.5)
WBC: 5.2 10*3/uL (ref 4.0–10.5)

## 2016-11-03 LAB — URINALYSIS, ROUTINE W REFLEX MICROSCOPIC
Bilirubin Urine: NEGATIVE
Glucose, UA: NEGATIVE mg/dL
Hgb urine dipstick: NEGATIVE
Ketones, ur: NEGATIVE mg/dL
Leukocytes, UA: NEGATIVE
NITRITE: NEGATIVE
PH: 5 (ref 5.0–8.0)
Protein, ur: NEGATIVE mg/dL
SPECIFIC GRAVITY, URINE: 1.009 (ref 1.005–1.030)

## 2016-11-03 LAB — LIPID PANEL
Cholesterol: 161 mg/dL (ref 0–200)
HDL: 63 mg/dL (ref >40)
LDL CALC: 90 mg/dL (ref 0–99)
Total CHOL/HDL Ratio: 2.6 RATIO
Triglycerides: 41 mg/dL (ref <150)
VLDL: 8 mg/dL (ref 0–40)

## 2016-11-03 LAB — HIV ANTIBODY (ROUTINE TESTING W REFLEX): HIV SCREEN 4TH GENERATION: NONREACTIVE

## 2016-11-03 LAB — TSH: TSH: 2.854 u[IU]/mL (ref 0.350–4.500)

## 2016-11-03 LAB — DIFFERENTIAL
BASOS PCT: 1 %
Basophils Absolute: 0 10*3/uL (ref 0.0–0.1)
Eosinophils Absolute: 0.2 10*3/uL (ref 0.0–0.7)
Eosinophils Relative: 4 %
LYMPHS ABS: 1 10*3/uL (ref 0.7–4.0)
Lymphocytes Relative: 20 %
MONO ABS: 0.4 10*3/uL (ref 0.1–1.0)
MONOS PCT: 8 %
NEUTROS ABS: 3.6 10*3/uL (ref 1.7–7.7)
Neutrophils Relative %: 67 %

## 2016-11-03 LAB — BASIC METABOLIC PANEL
ANION GAP: 5 (ref 5–15)
BUN: 22 mg/dL — ABNORMAL HIGH (ref 6–20)
CALCIUM: 9.1 mg/dL (ref 8.9–10.3)
CO2: 26 mmol/L (ref 22–32)
Chloride: 107 mmol/L (ref 101–111)
Creatinine, Ser: 1.16 mg/dL (ref 0.61–1.24)
Glucose, Bld: 99 mg/dL (ref 65–99)
Potassium: 4.6 mmol/L (ref 3.5–5.1)
SODIUM: 138 mmol/L (ref 135–145)

## 2016-11-03 LAB — TROPONIN I: Troponin I: <0.03 ng/mL (ref <0.03)

## 2016-11-03 LAB — I-STAT TROPONIN, ED: TROPONIN I, POC: 0 ng/mL (ref 0.00–0.08)

## 2016-11-03 MED ORDER — GI COCKTAIL ~~LOC~~: 30.0000 mL | Freq: Four times a day (QID) | ORAL | Status: DC | PRN

## 2016-11-03 MED ORDER — ASPIRIN EC 325 MG PO TBEC: 325.0000 mg | DELAYED_RELEASE_TABLET | Freq: Every day | ORAL | Status: DC

## 2016-11-03 MED ORDER — ALPRAZOLAM 0.25 MG PO TABS: 0.2500 mg | ORAL_TABLET | Freq: Two times a day (BID) | ORAL | Status: DC | PRN

## 2016-11-03 MED ORDER — MORPHINE SULFATE (PF) 4 MG/ML IV SOLN: 2.0000 mg | INTRAVENOUS | Status: DC | PRN

## 2016-11-03 MED ORDER — ENOXAPARIN SODIUM 40 MG/0.4ML ~~LOC~~ SOLN: 40.0000 mg | SUBCUTANEOUS | Status: DC

## 2016-11-03 MED ORDER — GABAPENTIN 300 MG PO CAPS
300.0000 mg | ORAL_CAPSULE | Freq: Four times a day (QID) | ORAL | Status: DC
  Administered 2016-11-03 (×2): 300 mg via ORAL
  Filled 2016-11-03 (×2): qty 1

## 2016-11-03 MED ORDER — ONDANSETRON HCL 4 MG/2ML IJ SOLN: 4.0000 mg | Freq: Four times a day (QID) | INTRAMUSCULAR | Status: DC | PRN

## 2016-11-03 MED ORDER — REGADENOSON 0.4 MG/5ML IV SOLN
INTRAVENOUS | Status: AC
  Administered 2016-11-03: 0.4 mg via INTRAVENOUS
  Filled 2016-11-03: qty 5

## 2016-11-03 MED ORDER — TECHNETIUM TC 99M TETROFOSMIN IV KIT
10.0000 | PACK | Freq: Once | INTRAVENOUS | Status: AC | PRN
  Administered 2016-11-03: 10 via INTRAVENOUS

## 2016-11-03 MED ORDER — NITROGLYCERIN 0.4 MG SL SUBL
0.4000 mg | SUBLINGUAL_TABLET | SUBLINGUAL | Status: DC | PRN
  Administered 2016-11-03 (×2): 0.4 mg via SUBLINGUAL
  Filled 2016-11-03: qty 1

## 2016-11-03 MED ORDER — ALPRAZOLAM 0.5 MG PO TABS
0.5000 mg | ORAL_TABLET | Freq: Four times a day (QID) | ORAL | Status: DC
  Administered 2016-11-03 (×2): 0.5 mg via ORAL
  Filled 2016-11-03: qty 1
  Filled 2016-11-03: qty 2

## 2016-11-03 MED ORDER — REGADENOSON 0.4 MG/5ML IV SOLN
0.4000 mg | Freq: Once | INTRAVENOUS | Status: AC
  Administered 2016-11-03: 0.4 mg via INTRAVENOUS
  Filled 2016-11-03: qty 5

## 2016-11-03 MED ORDER — ACETAMINOPHEN 325 MG PO TABS: 650.0000 mg | ORAL_TABLET | ORAL | Status: DC | PRN

## 2016-11-03 MED ORDER — TECHNETIUM TC 99M TETROFOSMIN IV KIT
30.0000 | PACK | Freq: Once | INTRAVENOUS | Status: AC | PRN
  Administered 2016-11-03: 30 via INTRAVENOUS

## 2016-11-03 NOTE — ED Notes (Signed)
Patient states the pressure in  His chest is gone, states he was having numbness in his right arm 3rd and 4th fingers, states that is getting better. States he doesn't want to be admitted to the hospital

## 2016-11-03 NOTE — H&P (Signed)
History and Physical    Erik MaltaDonald A Biswell NWG:[id number] DOB: 13-Nov-1960 DOA: 11/03/2016  [facility]  Chief Complaint: chest pain/numbness tingling right hand/finger  HPI: Erik Olson is a very pleasant 56 y.o. male with medical history significant for PTSD, anxiety presents to the emergency department with the chief complaint anterior chest pain with numbness and tingling in his right fourth and fifth fingers. [facility] asked to admit for chest pain rule out.  Information is obtained from the patient and his wife who is at the bedside. He was in his usual state of health driving to the gym early this morning when he developed sudden onset of right anterior chest pain with numbness and tingling to his right hand particularly his right fourth and fifth finger. He describes the pain as a "pressure" it radiated to the left side of his chest. Associated symptoms include mild diaphoresis with nausea no vomiting. He drove home was planning to go to the clinic when he all the sudden became "very fatigued". Wife states he could hardly walk. He states that he has a long history of working out that he has lost almost 100 pounds in the last year and a half. He also reports a remote history of C-spine injury associated with weightlifting. He remembers the symptoms being quite similar and he was restricted from lifting for 6 months. He denies shortness of breath dizziness abdominal pain lower extremity edema or orthopnea. He denies dysuria hematuria frequency or urgency. He denies diarrhea constipation melena bright red blood per rectum. He states he was in the emergency department 2 months ago with "dehydration". He was not admitted at that time.    ED Course: In the emergency department he is afebrile with a blood pressure on the low end of normal and bradycardic with a heart rate range of 34-53. He was given a nitroglycerin and IV fluids at the time of admission is  pain-free  Review of Systems: As per HPI otherwise all other systems reviewed and are negative.   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 Anxiety   . Bradycardia   . Chest pain   . Depression   . Numbness and tingling in right hand   . PTSD (post-traumatic stress disorder)     Past Surgical History:  Procedure Laterality Date  . WISDOM TOOTH EXTRACTION      Social History   Social History  . Marital status: Married    Spouse name: N/A  . Number of children: N/A  . Years of education: N/A   Occupational History  . Not on file.   Social History Main Topics  . Smoking status: Never Smoker  . Smokeless tobacco: Current User    Types: Snuff  . Alcohol use No  . Drug use: No  . Sexual activity: Not on file   Other Topics Concern  . Not on file   Social History Narrative  . No narrative on file    Allergies  Allergen Reactions  . Penicillins Anaphylaxis    Family History  Problem Relation Age of Onset  . Breast cancer Mother   . Suicidality Brother     Prior to Admission medications   Medication Sig Start Date End Date Taking? Authorizing Provider  ALPRAZolam Prudy Feeler(XANAX) 0.5 MG tablet Take 0.5 mg by mouth every 4 (  four) hours.     Yes [provider]  gabapentin (NEURONTIN) 300 MG capsule Take 300 mg by mouth 4 (four) times daily.     Yes [provider]    Physical Exam: Vitals:   11/03/16 0630 11/03/16 0645 11/03/16 0700 11/03/16 0747  BP: 109/80 102/67 100/68 110/74  Pulse: (!) 41 (!) 53 (!) 36 (!) 40  Resp: 13 12 12 14   SpO2: 100% 100% 100% 100%     General:  Appears Only slightly anxious but not uncomfortable Eyes:  PERRL, EOMI, normal lids, iris ENT:  grossly normal hearing, lips & tongue, mucous membranes of his mouth are pink slightly dry Neck:  no LAD, masses or thyromegaly Cardiovascular:   Bradycardia but regular no m/r/g. No LE edema.  Respiratory:  CTA bilaterally, no w/r/r. Normal respiratory effort. Abdomen:  soft, ntnd, positive bowel sounds throughout no guarding or rebounding Skin:  no rash or induration seen on limited exam Musculoskeletal:  grossly normal tone BUE/BLE, good ROM, no bony abnormality neck with full ROM, non-tender Psychiatric:  grossly normal mood and affect, speech fluent and appropriate, AOx3 Neurologic:  CN 2-12 grossly intact, moves all extremities in coordinated fashion, sensation intact speech clear facial symmetry bilateral grip 5 out of 5   Labs on Admission: I have personally reviewed following labs and imaging studies  CBC:  Recent Labs Lab 11/03/16 0500  WBC 5.2  NEUTROABS 3.6  HGB 11.7*  HCT 34.9*  MCV 98.9  PLT 179   Basic Metabolic Panel:  Recent Labs Lab 11/03/16 0500  NA 138  K 4.6  CL 107  CO2 26  GLUCOSE 99  BUN 22*  CREATININE 1.16  CALCIUM 9.1   GFR: CrCl cannot be calculated (Unknown ideal weight.). Liver Function Tests: No results for input(s): AST, ALT, ALKPHOS, BILITOT, PROT, ALBUMIN in the last 168 hours. No results for input(s): LIPASE, AMYLASE in the last 168 hours. No results for input(s): AMMONIA in the last 168 hours. Coagulation Profile: No results for input(s): INR, PROTIME in the last 168 hours. Cardiac Enzymes: No results for input(s): CKTOTAL, CKMB, CKMBINDEX, TROPONINI in the last 168 hours. BNP (last 3 results) No results for input(s): PROBNP in the last 8760 hours. HbA1C: No results for input(s): HGBA1C in the last 72 hours. CBG: No results for input(s): GLUCAP in the last 168 hours. Lipid Profile: No results for input(s): CHOL, HDL, LDLCALC, TRIG, CHOLHDL, LDLDIRECT in the last 72 hours. Thyroid Function Tests: No results for input(s): TSH, T4TOTAL, FREET4, T3FREE, THYROIDAB in the last 72 hours. Anemia Panel: No results for input(s): VITAMINB12, FOLATE, FERRITIN, TIBC, IRON,  RETICCTPCT in the last 72 hours. Urine analysis:    Component Value Date/Time   COLORURINE YELLOW 10/07/2016 0823   APPEARANCEUR CLEAR 10/07/2016 0823   LABSPEC 1.004 (L) 10/07/2016 0823   PHURINE 7.5 10/07/2016 0823   GLUCOSEU NEGATIVE 10/07/2016 0823   HGBUR NEGATIVE 10/07/2016 0823   BILIRUBINUR NEGATIVE 10/07/2016 0823   BILIRUBINUR neg 09/03/2010 1348   KETONESUR NEGATIVE 10/07/2016 0823   PROTEINUR NEGATIVE 10/07/2016 0823   UROBILINOGEN 0.2 10/22/2012 0914   NITRITE NEGATIVE 10/07/2016 0823   LEUKOCYTESUR NEGATIVE 10/07/2016 0823    Creatinine Clearance: CrCl cannot be calculated (Unknown ideal weight.).  Sepsis Labs: @LABRCNTIP (procalcitonin:4,lacticidven:4) )No results found for this or any previous visit (from the past 240 hour(s)).   Radiological Exams on Admission: Dg Chest 2 View  Result Date: 11/03/2016 CLINICAL DATA:  56 y/o M; chest tightness with right arm numbness and  bradycardia. EXAM: CHEST  2 VIEW COMPARISON:  05/02/2014 chest radiograph FINDINGS: Stable heart size and mediastinal contours are within normal limits. Both lungs are clear. The visualized skeletal structures are unremarkable. IMPRESSION: No acute pulmonary process identified. Electronically Signed   By: Mitzi Hansen M.D.   On: 11/03/2016 05:41    EKG:  Sinus bradycardia Nonspecific intraventricular conduction delay ST elev, probable normal early repol pattern When compared with ECG of 06/01/2016, No significant change was found  Assessment/Plan Principal Problem:   Chest pain Active Problems:   Anxiety state   Anemia   Numbness and tingling in right hand   Bradycardia   PTSD (post-traumatic stress disorder)   #1. Chest pain. Heart score 3. Initial troponin negative. EKG with nonspecific intraventricular conduction delay. He is provided with nitroglycerin is pain-free on admission. Low suspicion for cardiac source of discomfort. Cardiology notified by ED physician -Admit to  telemetry -Cycle troponin -Serial EKG -GI cocktail -Lipid panel -Await cardiology recommendation  #2. Numbness and tingling in the right hand/fingers. Patient with a remote history of neck injury associated with lifting weights. He reports symptoms quite similar. Denies any weakness of upper extremities. -We'll obtain plane film of C-spine  #3. Bradycardia. EKG as noted above. Patient asymptomatic with a heart rate in the 40s. Likely related to patient's high level of fitness. -Obtain a TSH -await cardiology recommendations  #4. PTSD. Patient under the care of physician at [facility]. Appears slightly anxious at the thought of being admitted. Home medications include Xanax and gabapentin. -Continue home meds -Hopefully we can rule out quickly and discharge this afternoon     DVT prophylaxis: lovenox Code Status: full  Family Communication: wife at bedside  Disposition Plan: home Consults called: cariology by ED   Admission status: obs    Gwenyth Bender MD [facility]  If 7PM-7AM, please contact night-coverage [email or web address] Password [facility]  11/03/2016, 7:55 AM

## 2016-11-03 NOTE — ED Provider Notes (Signed)
[facility]-EMERGENCY DEPT Provider Note   CSN: [id number] Arrival date & time: 11/03/16  0455     History   Chief Complaint Chief Complaint  Patient presents with  . Chest Pain    HPI Erik Olson is a 56 y.o. male.  The history is provided by the patient.  He had onset about 3:30 AM of numbness in his entire right arm, followed by a pressure feeling in his chest. There was no associated dyspnea but there was some nausea and he did feel a little clammy. Nothing seemed to make it better, nothing made it worse. He had been having numbness in his right fourth and fifth fingers for the last 2 days. He came in by ambulance who gave him aspirin and nitroglycerin with significant relief of the chest discomfort. Chest discomfort went from 6/10 down to 2/10. He is a nonsmoker but does use smokeless tobacco. There is no history of diabetes, hypertension, hyperlipidemia. There is no known family history of premature coronary atherosclerosis.  Past Medical History:  Diagnosis Date  . Anxiety   . Depression   . PTSD (post-traumatic stress disorder)     Patient Active Problem List   Diagnosis Date Noted  . Gastroenteritis 09/03/2010  . ACUTE PHARYNGITIS 05/29/2009  . FATIGUE 05/29/2009  . DIZZINESS 01/30/2009  . CHEST PAIN UNSPECIFIED 01/30/2009  . ALCOHOL USE 06/29/2008  . SYNCOPE 06/29/2008  . ANXIETY 01/26/2007  . DEPRESSION 12/31/2006    Past Surgical History:  Procedure Laterality Date  . WISDOM TOOTH EXTRACTION         Home Medications    Prior to Admission medications   Medication Sig Start Date End Date Taking? Authorizing Provider  ALPRAZolam Prudy Feeler(XANAX) 0.5 MG tablet Take 0.5 mg by mouth every 4 (four) hours.      [provider]  gabapentin (NEURONTIN) 300 MG capsule Take 300 mg by mouth 4 (four) times daily.      [provider]  traMADol (ULTRAM) 50 MG tablet Take 1 tablet (50 mg total) by mouth every 6 (six) hours as needed. 10/07/16   Geoffery Lyonselo, Douglas,  MD    Family History No family history on file.  Social History Social History  Substance Use Topics  . Smoking status: Never Smoker  . Smokeless tobacco: Current User    Types: Snuff  . Alcohol use No     Allergies   Penicillins   Review of Systems Review of Systems  All other systems reviewed and are negative.    Physical Exam Updated Vital Signs BP 100/60   Pulse (!) 43   Resp 12   SpO2 95%   Physical Exam  Nursing note and vitals reviewed.  56 year old male, resting comfortably and in no acute distress. Vital signs are significant for bradycardia. Oxygen saturation is 95%, which is normal. Head is normocephalic and atraumatic. PERRLA, EOMI. Oropharynx is clear. Neck is tender in the lower cervical region. There is no adenopathy or JVD. Back is nontender and there is no CVA tenderness. Lungs are clear without rales, wheezes, or rhonchi. Chest is nontender. Heart has regular rate and rhythm without murmur. Abdomen is soft, flat, nontender without masses or hepatosplenomegaly and peristalsis is normoactive. Extremities have no cyanosis or edema, full range of motion is present. Skin is warm and dry without rash. Neurologic: Mental status is normal, cranial nerves are intact. Strength is 5/5 in all tested muscle groups and is symmetric. There is slight decreased pinprick sensation throughout the right forearm,  and under the right fifth finger. This does not follow a classic dermatome distribution.  ED Treatments / Results  Labs (all labs ordered are listed, but only abnormal results are displayed) Labs Reviewed  BASIC METABOLIC PANEL - Abnormal; Notable for the following:       Result Value   BUN 22 (*)    All other components within normal limits  CBC - Abnormal; Notable for the following:    RBC 3.53 (*)    Hemoglobin 11.7 (*)    HCT 34.9 (*)    All other components within normal limits  DIFFERENTIAL  I-STAT TROPONIN, ED    EKG  EKG  Interpretation  Date/Time:  Monday November 03 2016 04:56:37 EDT Ventricular Rate:  45 PR Interval:    QRS Duration: 116 QT Interval:  457 QTC Calculation: 396 R Axis:   24 Text Interpretation:  Sinus bradycardia Nonspecific intraventricular conduction delay ST elev, probable normal early repol pattern When compared with ECG of 06/01/2016, No significant change was found Confirmed by Dione Booze ([id number]) on 11/03/2016 5:03:47 AM       Radiology Dg Chest 2 View  Result Date: 11/03/2016 CLINICAL DATA:  56 y/o M; chest tightness with right arm numbness and bradycardia. EXAM: CHEST  2 VIEW COMPARISON:  05/02/2014 chest radiograph FINDINGS: Stable heart size and mediastinal contours are within normal limits. Both lungs are clear. The visualized skeletal structures are unremarkable. IMPRESSION: No acute pulmonary process identified. Electronically Signed   By: Mitzi Hansen M.D.   On: 11/03/2016 05:41    Procedures Procedures (including critical care time)  Medications Ordered in ED Medications  nitroGLYCERIN (NITROSTAT) SL tablet 0.4 mg (0.4 mg Sublingual Given 11/03/16 0541)     Initial Impression / Assessment and Plan / ED Course  I have reviewed the triage vital signs and the nursing notes.  Pertinent labs & imaging results that were available during my care of the patient were reviewed by me and considered in my medical decision making (see chart for details).  Chest discomfort of uncertain cause. ECG is unchanged, and he has minimal risk factors for coronary artery disease. However, significant response to nitroglycerin is worrisome. He was given additional nodular listed in the ED. Old records are reviewed, and he has a prior ED visit for near-syncope this year, and an ED visit for chest pain in the remote past.  ED workup shows mild anemia, which is new. He became completely free of chest discomfort following nitroglycerin. Although his heart score is relatively low at 3,  history and response to nitroglycerin are worrisome enough that I feel he should be admitted for serial troponins. Case is discussed with Dr. Julian Reil of [facility] who agrees to admit the patient. Of note, while resting in bed, heart rate did drop down into the 30s. Patient was asymptomatic with this, and heart rate came up into the upper 40s as soon as I would start talking with him. This is felt to be a physiologic sinus bradycardia based on patient's high level of fitness.  Final Clinical Impressions(s) / ED Diagnoses   Final diagnoses:  Chest pain, unspecified type  Sinus bradycardia  Normochromic normocytic anemia    New Prescriptions New Prescriptions   No medications on file     Dione Booze, MD 11/03/16 [phone]

## 2016-11-03 NOTE — ED Notes (Signed)
Pt. returned from XR. 

## 2016-11-03 NOTE — ED Triage Notes (Signed)
Patient arrived with EMS from home reports sudden onset chest pressure with mild nausea and " fatigue" this morning , he received ASA 324 mg and NTG sl x1 by EMS prior to arrival with slight relief , rates pain 2/10 at arrival , bradycardic 48/min /respirations unlabored .

## 2016-11-03 NOTE — Discharge Instructions (Signed)
You had a normal stress test. There is no evidence that you have experienced a heart attack and your heart appears to be in good health. Some of the symptoms you were experiencing may have been from nerve root impingement along your neck due to spinal stenosis as seen on your xray. The official read on your x-ray is as follows:  FINDINGS: Frontal, lateral, and open-mouth odontoid images were obtained. There is no fracture or spondylolisthesis. Prevertebral soft tissues and predental space regions are normal. There is moderately severe disc space narrowing at C6-7. There is slight disc space narrowing at C4-5 and C5-6. No erosive change. There is slight cervical dextroscoliosis. Lung apices are clear.  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 Slight scoliosis. No fracture or spondylolisthesis.  Additionally your profound bradycardia (low heart rate) and anxiety and stress alone can mimic heart pains and may have contributed to your overall symptoms.  Please follow up with your primary doctor as needed.

## 2016-11-03 NOTE — Consult Note (Signed)
Reason for Consult: Chest pain/marked sinus bradycardia Referring Physician: [facility]  Erik Olson is an 56 y.o. male.  HPI: Patient is 56 year old male with past medical history significant for PTSD, tobacco abuse, anxiety disorder, sinus bradycardia asymptomatic came to the ER complaining of retrosternal chest pressure grade 4/10 associated with nausea and diaphoresis while driving associated with right arm and finger numbness. Patient received 2 sublingual nitroglycerin in the ED which is chest pain. EKG done in the ED showed marked sinus bradycardia with nonspecific intraventricular conduction delay no acute ischemic changes were noted first 2 sets of cardiac enzymes have been negative. Patient denies such episodes of chest pain in the past. Denies any exertional chest pain.. Denies any shortness of breath. Denies palpitation lightheadedness or syncope. Patient was noted to be in marked sinus bradycardia heart rate in 30s to low 40s asymptomatic patient states he has been bradycardic all his life since being in [employer].   Past Medical History:  Diagnosis Date  . Anxiety   . Bradycardia   . Chest pain   . Depression   . Numbness and tingling in right hand   . PTSD (post-traumatic stress disorder)     Past Surgical History:  Procedure Laterality Date  . WISDOM TOOTH EXTRACTION      Family History  Problem Relation Age of Onset  . Breast cancer Mother   . Suicidality Brother     Social History:  reports that he has never smoked. His smokeless tobacco use includes Snuff. He reports that he does not drink alcohol or use drugs.  Allergies:  Allergies  Allergen Reactions  . Penicillins Anaphylaxis    Medications: I have reviewed the patient's current medications.  Results for orders placed or performed during the hospital encounter of 11/03/16 (from the past 48 hour(s))  Basic metabolic panel     Status: Abnormal   Collection Time: 11/03/16  5:00 AM  Result Value  Ref Range   Sodium 138 135 - 145 mmol/L   Potassium 4.6 3.5 - 5.1 mmol/L   Chloride 107 101 - 111 mmol/L   CO2 26 22 - 32 mmol/L   Glucose, Bld 99 65 - 99 mg/dL   BUN 22 (H) 6 - 20 mg/dL   Creatinine, Ser 1.16 0.61 - 1.24 mg/dL   Calcium 9.1 8.9 - 10.3 mg/dL   GFR calc non Af Amer >60 >60 mL/min   GFR calc Af Amer >60 >60 mL/min    Comment: (NOTE) The eGFR has been calculated using the CKD EPI equation. This calculation has not been validated in all clinical situations. eGFR's persistently <60 mL/min signify possible Chronic Kidney Disease.    Anion gap 5 5 - 15  CBC     Status: Abnormal   Collection Time: 11/03/16  5:00 AM  Result Value Ref Range   WBC 5.2 4.0 - 10.5 K/uL   RBC 3.53 (L) 4.22 - 5.81 MIL/uL   Hemoglobin 11.7 (L) 13.0 - 17.0 g/dL   HCT 34.9 (L) 39.0 - 52.0 %   MCV 98.9 78.0 - 100.0 fL   MCH 33.1 26.0 - 34.0 pg   MCHC 33.5 30.0 - 36.0 g/dL   RDW 13.2 11.5 - 15.5 %   Platelets 179 150 - 400 K/uL  Differential     Status: None   Collection Time: 11/03/16  5:00 AM  Result Value Ref Range   Neutrophils Relative % 67 %   Neutro Abs 3.6 1.7 - 7.7 K/uL   Lymphocytes Relative  20 %   Lymphs Abs 1.0 0.7 - 4.0 K/uL   Monocytes Relative 8 %   Monocytes Absolute 0.4 0.1 - 1.0 K/uL   Eosinophils Relative 4 %   Eosinophils Absolute 0.2 0.0 - 0.7 K/uL   Basophils Relative 1 %   Basophils Absolute 0.0 0.0 - 0.1 K/uL  I-stat troponin, ED     Status: None   Collection Time: 11/03/16  5:05 AM  Result Value Ref Range   Troponin i, poc 0.00 0.00 - 0.08 ng/mL   Comment 3            Comment: Due to the release kinetics of cTnI, a negative result within the first hours of the onset of symptoms does not rule out myocardial infarction with certainty. If myocardial infarction is still suspected, repeat the test at appropriate intervals.   TSH     Status: None   Collection Time: 11/03/16  7:36 AM  Result Value Ref Range   TSH 2.854 0.350 - 4.500 uIU/mL    Comment:  Performed by a 3rd Generation assay with a functional sensitivity of <=0.01 uIU/mL.  Lipid panel     Status: None   Collection Time: 11/03/16  7:51 AM  Result Value Ref Range   Cholesterol 161 0 - 200 mg/dL   Triglycerides 41 <150 mg/dL   HDL 63 >40 mg/dL   Total CHOL/HDL Ratio 2.6 RATIO   VLDL 8 0 - 40 mg/dL   LDL Cholesterol 90 0 - 99 mg/dL    Comment:        Total Cholesterol/HDL:CHD Risk Coronary Heart Disease Risk Table                     Men   Women  1/2 Average Risk   3.4   3.3  Average Risk       5.0   4.4  2 X Average Risk   9.6   7.1  3 X Average Risk  23.4   11.0        Use the calculated Patient Ratio above and the CHD Risk Table to determine the patient's CHD Risk.        ATP III CLASSIFICATION (LDL):  <100     mg/dL   Optimal  100-129  mg/dL   Near or Above                    Optimal  130-159  mg/dL   Borderline  160-189  mg/dL   High  >190     mg/dL   Very High   Troponin I     Status: None   Collection Time: 11/03/16  7:51 AM  Result Value Ref Range   Troponin I <0.03 <0.03 ng/mL    Dg Chest 2 View  Result Date: 11/03/2016 CLINICAL DATA:  56 y/o M; chest tightness with right arm numbness and bradycardia. EXAM: CHEST  2 VIEW COMPARISON:  05/02/2014 chest radiograph FINDINGS: Stable heart size and mediastinal contours are within normal limits. Both lungs are clear. The visualized skeletal structures are unremarkable. IMPRESSION: No acute pulmonary process identified. Electronically Signed   By: Kristine Garbe M.D.   On: 11/03/2016 05:41   Dg Cervical Spine 2 Or 3 Views  Result Date: 11/03/2016 CLINICAL DATA:  Right upper extremity numbness EXAM: CERVICAL SPINE - 2-3 VIEW COMPARISON:  None. FINDINGS: Frontal, lateral, and open-mouth odontoid images were obtained. There is no fracture or spondylolisthesis. Prevertebral soft tissues and predental space  regions are normal. There is moderately severe disc space narrowing at C6-7. There is slight disc  space narrowing at C4-5 and C5-6. No erosive change. There is slight cervical dextroscoliosis. Lung apices are clear. 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 Slight scoliosis. No fracture or spondylolisthesis. Electronically Signed   By: Lowella Grip III M.D.   On: 11/03/2016 08:05    Review of Systems  Constitutional: Negative for chills and fever.  HENT: Negative for hearing loss.   Eyes: Negative for blurred vision.  Respiratory: Negative for cough.   Cardiovascular: Positive for chest pain. Negative for palpitations, orthopnea, claudication, leg swelling and PND.  Gastrointestinal: Positive for nausea. Negative for vomiting.  Genitourinary: Negative for dysuria.  Neurological: Negative for dizziness.   Blood pressure 110/74, pulse (!) 40, resp. rate 14, SpO2 100 %. Physical Exam  Constitutional: He is oriented to person, place, and time.  HENT:  Head: Normocephalic and atraumatic.  Eyes: Pupils are equal, round, and reactive to light. Conjunctivae are normal. Left eye exhibits no discharge.  Neck: Normal range of motion. Neck supple. No JVD present. No tracheal deviation present. No thyromegaly present.  Cardiovascular: Regular rhythm.  Exam reveals no gallop and no friction rub.   Murmur heard. Respiratory: Effort normal and breath sounds normal. No respiratory distress. He has no wheezes. He has no rales.  GI: Soft. Bowel sounds are normal. He exhibits no distension. There is no tenderness. There is no rebound.  Musculoskeletal: He exhibits no edema, tenderness or deformity.  Neurological: He is alert and oriented to person, place, and time.    Assessment/Plan: Chest pain worrisome for angina rule out MI Marked sinus bradycardia asymptomatic PTSD Tobacco abuse Anxiety disorder Degenerative cervical disc disease rule out radiculopathy Anemia questionable etiology Plan Check serial enzymes and EKG, Lipid panel, TSH Hold beta blockers  in view of bradycardia Nitroglycerin and calcium channel blockers as blood pressure tolerates Keep nothing by mouth Will schedule for stress Myoview  Charolette Forward 11/03/2016, 8:28 AM

## 2016-11-03 NOTE — ED Notes (Signed)
Dr. Preston FleetingGlick notified on pt.'s HR = 34/min .

## 2016-11-03 NOTE — ED Notes (Signed)
Patient transported to X-ray 

## 2016-11-03 NOTE — ED Notes (Signed)
Dr. Harwani at bedside. 

## 2016-11-03 NOTE — ED Notes (Signed)
Report called to 2 [address] 26

## 2016-11-04 ENCOUNTER — Encounter (HOSPITAL_COMMUNITY): Payer: Self-pay | Admitting: Internal Medicine

## 2016-11-04 DIAGNOSIS — M9979 Connective tissue and disc stenosis of intervertebral foramina of abdomen and other regions: Secondary | ICD-10-CM | POA: Diagnosis present

## 2016-11-04 NOTE — Discharge Summary (Signed)
Physician Discharge Summary  Erik Olson:[id number] DOB: 06/26/1960 DOA: 11/03/2016  PCP: [facility]  Admit date: 11/03/2016 Discharge date: 11/04/2016  Time spent: 55 minutes  Recommendations for Outpatient Follow-up:  1. PCP in 1-2 weeks  2.    Discharge Diagnoses:  Principal Problem:   Chest pain Active Problems:   Anxiety state   Anemia   Numbness and tingling in right hand   Bradycardia   PTSD (post-traumatic stress disorder)   Normochromic normocytic anemia   Narrowing of intervertebral disc space   Discharge Condition: good  Diet recommendation: regular  Filed Weights   11/03/16 0853  Weight: 83.4 kg (183 lb 12.8 oz)    History of present illness:   Erik Olson is a very pleasant 56 y.o. male with medical history significant for PTSD, anxiety presents to the emergency department with the chief complaint anterior chest pain with numbness and tingling in his right fourth and fifth fingers. [facility] asked to admit for chest pain rule out.  Information is obtained from the patient and his wife who is at the bedside. He was in his usual state of health driving to the gym early this morning when he developed sudden onset of right anterior chest pain with numbness and tingling to his right hand particularly his right fourth and fifth finger. He describes the pain as a "pressure" it radiated to the left side of his chest. Associated symptoms include mild diaphoresis with nausea no vomiting. He drove home was planning to go to the clinic when he all the sudden became "very fatigued". Wife states he could hardly walk. He states that he has a long history of working out that he has lost almost 100 pounds in the last year and a half. He also reports a remote history of C-spine injury associated with weightlifting. He remembers the symptoms being quite similar and he was restricted from lifting for 6 months. He denies shortness of breath dizziness abdominal pain  lower extremity edema or orthopnea. He denies dysuria hematuria frequency or urgency. He denies diarrhea constipation melena bright red blood per rectum. He states he was in the emergency department 2 months ago with "dehydration". He was not admitted at that time.   Hospital Course:  Patient presented with atypical chest pain and bradycardia. Troponins were negative. EKG without signs of ischemia. He was evaluated by cardiology who recommended a stress test. He underwent stress test that was negative and patient discharge by the end of the day  Procedures: Stress test revealingMild reversibility in the septal wall in the mid and basal segments. Findings are equivocal for ischemia. No other areas are concerning for reversibility or ischemia. Mild dilatation of left ventricle with normal wall motion. Left ventricular ejection fraction is 48%. Non invasive risk stratification: Intermediate     Exam: Vitals:   11/03/16 1300 11/03/16 1700  BP: 110/70 112/73  Pulse: 80 (!) 44  Resp: 18 18  Temp: 98 F (36.7 C) 98.1 F (36.7 C)  SpO2: 100% 100%    General: Alert oriented 3 well-nourished calm in no acute distress Cardiovascular: Regular rate and rhythm no murmur gallop or rub Respiratory: Normal effort breath sounds are clear throughout hear no wheeze no rhonchi  Discharge Instructions    Discharge Medication List as of 11/03/2016  5:29 PM    CONTINUE these medications which have NOT CHANGED   Details  ALPRAZolam (XANAX) 0.5 MG tablet Take 0.5 mg by mouth every 4 (four) hours.  , Historical  Med    gabapentin (NEURONTIN) 300 MG capsule Take 300 mg by mouth 4 (four) times daily.  , Historical Med       Allergies  Allergen Reactions  . Penicillins Anaphylaxis      The results of significant diagnostics from this hospitalization (including imaging, microbiology, ancillary and laboratory) are listed below for reference.    Significant Diagnostic Studies: Dg Chest 2  View  Result Date: 11/03/2016 CLINICAL DATA:  56 y/o M; chest tightness with right arm numbness and bradycardia. EXAM: CHEST  2 VIEW COMPARISON:  05/02/2014 chest radiograph FINDINGS: Stable heart size and mediastinal contours are within normal limits. Both lungs are clear. The visualized skeletal structures are unremarkable. IMPRESSION: No acute pulmonary process identified. Electronically Signed   By: Mitzi Hansen M.D.   On: 11/03/2016 05:41   Dg Cervical Spine 2 Or 3 Views  Result Date: 11/03/2016 CLINICAL DATA:  Right upper extremity numbness EXAM: CERVICAL SPINE - 2-3 VIEW COMPARISON:  None. FINDINGS: Frontal, lateral, and open-mouth odontoid images were obtained. There is no fracture or spondylolisthesis. Prevertebral soft tissues and predental space regions are normal. There is moderately severe disc space narrowing at C6-7. There is slight disc space narrowing at C4-5 and C5-6. No erosive change. There is slight cervical dextroscoliosis. Lung apices are clear. 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 Slight scoliosis. No fracture or spondylolisthesis. Electronically Signed   By: Bretta Bang III M.D.   On: 11/03/2016 08:05   Nm Myocar Multi W/spect W/wall Motion / Ef  Result Date: 11/03/2016 CLINICAL DATA:  56 year old with chest pain. EXAM: MYOCARDIAL IMAGING WITH SPECT (REST AND PHARMACOLOGIC-STRESS) GATED LEFT VENTRICULAR WALL MOTION STUDY LEFT VENTRICULAR EJECTION FRACTION TECHNIQUE: Standard myocardial SPECT imaging was performed after resting intravenous injection of 10 mCi Tc-10m tetrofosmin. Subsequently, intravenous infusion of Lexiscan was performed under the supervision of the Cardiology staff. At peak effect of the drug, 30 mCi Tc-18m tetrofosmin was injected intravenously and standard myocardial SPECT imaging was performed. Quantitative gated imaging was also performed to evaluate left ventricular wall motion, and estimate left  ventricular ejection fraction. COMPARISON:  Chest radiograph 11/03/2016 FINDINGS: Perfusion: Mild reversibility along the septal wall in the mid and basal segments. Otherwise, no evidence for reversibility or infarct. There is gut uptake on the rest images. Wall Motion: Left ventricle is mildly dilated. No focal wall motion abnormality. Left Ventricular Ejection Fraction: 48% End diastolic volume 168 ml End systolic volume 87 ml IMPRESSION: 1. Mild reversibility in the septal wall in the mid and basal segments. Findings are equivocal for ischemia. No other areas are concerning for reversibility or ischemia. 2. Mild dilatation of left ventricle with normal wall motion. 3. Left ventricular ejection fraction is 48%. 4. Non invasive risk stratification*: Intermediate *2012 Appropriate Use Criteria for Coronary Revascularization Focused Update: J Am Coll Cardiol. 2012;59(9):857-881. http://content.dementiazones.com.aspx?articleid=[email or web address] Electronically Signed   By: Richarda Overlie M.D.   On: 11/03/2016 16:54   Ct Renal Stone Study  Result Date: 10/07/2016 CLINICAL DATA:  Bilateral flank pain and vomiting beginning yesterday. EXAM: CT ABDOMEN AND PELVIS WITHOUT CONTRAST TECHNIQUE: Multidetector CT imaging of the abdomen and pelvis was performed following the standard protocol without IV contrast. COMPARISON:  01/31/2013 FINDINGS: Lower chest: No acute findings. Hepatobiliary: No masses visualized on this unenhanced exam. Gallbladder is unremarkable. Pancreas: No mass or inflammatory process visualized on this unenhanced exam. Spleen:  Within normal limits in size. Adrenals/Urinary tract: No evidence of urolithiasis or hydronephrosis. Unremarkable unopacified urinary bladder. Stomach/Bowel:  No evidence of obstruction, inflammatory process, or abnormal fluid collections. Vascular/Lymphatic: No pathologically enlarged lymph nodes identified. No evidence of abdominal aortic aneurysm. Reproductive:  No mass or other  significant abnormality. Other:  None. Musculoskeletal:  No suspicious bone lesions identified. IMPRESSION: No evidence of urolithiasis, hydronephrosis, or other acute findings. Electronically Signed   By: Myles RosenthalJohn  Stahl M.D.   On: 10/07/2016 09:05    Microbiology: No results found for this or any previous visit (from the past 240 hour(s)).   Labs: Basic Metabolic Panel:  Recent Labs Lab 11/03/16 0500  NA 138  K 4.6  CL 107  CO2 26  GLUCOSE 99  BUN 22*  CREATININE 1.16  CALCIUM 9.1   Liver Function Tests: No results for input(s): AST, ALT, ALKPHOS, BILITOT, PROT, ALBUMIN in the last 168 hours. No results for input(s): LIPASE, AMYLASE in the last 168 hours. No results for input(s): AMMONIA in the last 168 hours. CBC:  Recent Labs Lab 11/03/16 0500  WBC 5.2  NEUTROABS 3.6  HGB 11.7*  HCT 34.9*  MCV 98.9  PLT 179   Cardiac Enzymes:  Recent Labs Lab 11/03/16 0751 11/03/16 1555  TROPONINI <0.03 <0.03   BNP: BNP (last 3 results) No results for input(s): BNP in the last 8760 hours.  ProBNP (last 3 results) No results for input(s): PROBNP in the last 8760 hours.  CBG: No results for input(s): GLUCAP in the last 168 hours.     Signed:  Gwenyth BenderBLACK,KAREN M MD.  [facility] 11/04/2016, 6:34 AM

## 2016-11-08 ENCOUNTER — Encounter (HOSPITAL_BASED_OUTPATIENT_CLINIC_OR_DEPARTMENT_OTHER): Payer: Self-pay | Admitting: *Deleted

## 2016-11-08 ENCOUNTER — Ambulatory Visit (HOSPITAL_BASED_OUTPATIENT_CLINIC_OR_DEPARTMENT_OTHER)
Admit: 2016-11-08 | Discharge: 2016-11-08 | Disposition: A | Discharge status: Home / Self Care | Payer: Non-veteran care | Attending: Emergency Medicine | Admitting: Emergency Medicine

## 2016-11-08 ENCOUNTER — Emergency Department (HOSPITAL_BASED_OUTPATIENT_CLINIC_OR_DEPARTMENT_OTHER)
Admission: EM | Admit: 2016-11-08 | Discharge: 2016-11-08 | Disposition: A | Discharge status: Home / Self Care | Payer: Non-veteran care | Attending: Emergency Medicine | Admitting: Emergency Medicine

## 2016-11-08 DIAGNOSIS — R109 Unspecified abdominal pain: Secondary | ICD-10-CM | POA: Diagnosis present

## 2016-11-08 DIAGNOSIS — R1010 Upper abdominal pain, unspecified: Secondary | ICD-10-CM

## 2016-11-08 DIAGNOSIS — Z79899 Other long term (current) drug therapy: Secondary | ICD-10-CM | POA: Diagnosis not present

## 2016-11-08 DIAGNOSIS — R1013 Epigastric pain: Secondary | ICD-10-CM | POA: Diagnosis not present

## 2016-11-08 LAB — CBC WITH DIFFERENTIAL/PLATELET
Basophils Absolute: 0 10*3/uL (ref 0.0–0.1)
Basophils Relative: 1 %
EOS PCT: 4 %
Eosinophils Absolute: 0.2 10*3/uL (ref 0.0–0.7)
HCT: 34.9 % — ABNORMAL LOW (ref 39.0–52.0)
HEMOGLOBIN: 11.9 g/dL — AB (ref 13.0–17.0)
LYMPHS ABS: 1 10*3/uL (ref 0.7–4.0)
Lymphocytes Relative: 27 %
MCH: 34.1 pg — AB (ref 26.0–34.0)
MCHC: 34.1 g/dL (ref 30.0–36.0)
MCV: 100 fL (ref 78.0–100.0)
Monocytes Absolute: 0.3 10*3/uL (ref 0.1–1.0)
Monocytes Relative: 9 %
Neutro Abs: 2.1 10*3/uL (ref 1.7–7.7)
Neutrophils Relative %: 59 %
Platelets: 181 10*3/uL (ref 150–400)
RBC: 3.49 MIL/uL — AB (ref 4.22–5.81)
RDW: 12.3 % (ref 11.5–15.5)
WBC: 3.6 10*3/uL — AB (ref 4.0–10.5)

## 2016-11-08 LAB — COMPREHENSIVE METABOLIC PANEL
ALK PHOS: 61 U/L (ref 38–126)
ALT: 15 U/L — AB (ref 17–63)
AST: 23 U/L (ref 15–41)
Albumin: 4 g/dL (ref 3.5–5.0)
Anion gap: 7 (ref 5–15)
BILIRUBIN TOTAL: 0.6 mg/dL (ref 0.3–1.2)
BUN: 28 mg/dL — ABNORMAL HIGH (ref 6–20)
CALCIUM: 8.9 mg/dL (ref 8.9–10.3)
CO2: 26 mmol/L (ref 22–32)
CREATININE: 0.89 mg/dL (ref 0.61–1.24)
Chloride: 103 mmol/L (ref 101–111)
Glucose, Bld: 87 mg/dL (ref 65–99)
Potassium: 4.5 mmol/L (ref 3.5–5.1)
Sodium: 136 mmol/L (ref 135–145)
Total Protein: 6.6 g/dL (ref 6.5–8.1)

## 2016-11-08 LAB — LIPASE, BLOOD: LIPASE: 22 U/L (ref 11–51)

## 2016-11-08 MED ORDER — PANTOPRAZOLE SODIUM 40 MG IV SOLR
40.0000 mg | Freq: Once | INTRAVENOUS | Status: AC
  Administered 2016-11-08: 40 mg via INTRAVENOUS
  Filled 2016-11-08: qty 40

## 2016-11-08 MED ORDER — ONDANSETRON HCL 4 MG/2ML IJ SOLN
4.0000 mg | Freq: Once | INTRAMUSCULAR | Status: AC
  Administered 2016-11-08: 4 mg via INTRAVENOUS
  Filled 2016-11-08: qty 2

## 2016-11-08 NOTE — ED Notes (Signed)
EDP into room 

## 2016-11-08 NOTE — ED Notes (Addendum)
Repoerted: pt declining/ refusing EKG at this time, agitated.

## 2016-11-08 NOTE — ED Notes (Signed)
Pt here for chest pressure 4/10 and abd gnawing pain 8/10, also nausea, describes as intermittent and recurrent. Onset this am started after working out. "Works out to treat and manage PTSD". Recent episodes worked up in the [place] and at [employer]. Has [place] scheduled to check his gall bladder next Friday. No meds PTA. Mentions stool as "pitch black" or tan. (denies: known bleeding, vd, sob, cough, congestion, cold sx, numbness or tingling, dizziness or other sx).  Alert, NAD, calm, interactive, resps e/u, speaking in clear complete sentences, no dyspnea noted, skin W&D. [facility] at [facility].

## 2016-11-08 NOTE — ED Provider Notes (Signed)
Pt returned to the ED for a gallbladder US.  It was normal.  I went over the results with the pt.  The pt still has a little bit of pain, so he is told to f/u with his pcp for a HIDA scan if sx persist.    Jacalyn Lefevre, MD 11/08/16 1034

## 2016-11-08 NOTE — ED Provider Notes (Signed)
[facility] Provider Note: Erik Dell, MD, FACEP  CSN: [id number] MRN: [id number] ARRIVAL: 11/08/16 at 0415 ROOM: MH02/[facility]02   CHIEF COMPLAINT  Abdominal Pain   HISTORY OF PRESENT ILLNESS  11/08/16 5:37 AM Erik Olson is a 56 y.o. male who works out in the gym on a daily basis. He was working out in the gym this morning about 3:30. While doing bench presses he developed some pressure in his chest which was accompanied by a 9 out of 10 burning pain in his epigastrium which radiated to his left upper quadrant. There was associated nausea but no vomiting or diarrhea. There was no associated shortness of breath. His symptoms have subsequently improved but have not completely abated. He had similar symptoms on the 13th of this month and was seen in the [facility] ED. A cardiac workup including a Persantine thallium stress test showed no evidence of ischemia. He does have known resting bradycardia due to his athletic nature. He is concerned he may have gallbladder trouble.   Past Medical History:  Diagnosis Date  . Anxiety   . Bradycardia   . Chest pain   . Depression   . Narrowing of intervertebral disc space    C6-7  . Numbness and tingling in right hand   . PTSD (post-traumatic stress disorder)     Past Surgical History:  Procedure Laterality Date  . WISDOM TOOTH EXTRACTION      Family History  Problem Relation Age of Onset  . Breast cancer Mother   . Suicidality Brother     Social History  Substance Use Topics  . Smoking status: Never Smoker  . Smokeless tobacco: Current User    Types: Snuff  . Alcohol use No    Prior to Admission medications   Medication Sig Start Date End Date Taking? Authorizing Provider  ALPRAZolam Prudy Feeler) 0.5 MG tablet Take 0.5 mg by mouth every 4 (four) hours.      [provider]  gabapentin (NEURONTIN) 300 MG capsule Take 300 mg by mouth 4 (four) times daily.      [provider]     Allergies Penicillins   REVIEW OF SYSTEMS  Negative except as noted here or in the History of Present Illness.   PHYSICAL EXAMINATION  Initial Vital Signs Height 6' (1.829 m), weight 83.9 kg (185 lb).  Examination General: Well-developed, well-nourished male in no acute distress; appearance consistent with age of record HENT: normocephalic; atraumatic Eyes: pupils equal, round and reactive to light; extraocular muscles intact Neck: supple Heart: regular rate and rhythm Lungs: clear to auscultation bilaterally Abdomen: soft; nondistended; epigastric, right upper quadrant and left upper quadrant tenderness; no masses or hepatosplenomegaly; bowel sounds present Extremities: No deformity; full range of motion; pulses normal Neurologic: Awake, alert and oriented; motor function intact in all extremities and symmetric; no facial droop Skin: Warm and dry Psychiatric: Normal mood and affect   RESULTS  Summary of this visit's results, reviewed by myself:  EKG Interpretation:  Date & Time: 11/08/2016 4:56 AM  Rate: 45  Rhythm: sinus bradycardia  QRS Axis: normal  Intervals: normal  ST/T Wave abnormalities: normal  Conduction Disutrbances:none  Narrative Interpretation:   Old EKG Reviewed: no significant changes   Laboratory Studies: Results for orders placed or performed during the hospital encounter of 11/08/16 (from the past 24 hour(s))  CBC with Differential/Platelet     Status: Abnormal   Collection Time: 11/08/16  6:06 AM  Result Value Ref Range  WBC 3.6 (L) 4.0 - 10.5 K/uL   RBC 3.49 (L) 4.22 - 5.81 MIL/uL   Hemoglobin 11.9 (L) 13.0 - 17.0 g/dL   HCT 82.8 (L) 00.3 - 49.1 %   MCV 100.0 78.0 - 100.0 fL   MCH 34.1 (H) 26.0 - 34.0 pg   MCHC 34.1 30.0 - 36.0 g/dL   RDW 79.1 50.5 - 69.7 %   Platelets 181 150 - 400 K/uL   Neutrophils Relative % 59 %   Neutro Abs 2.1 1.7 - 7.7 K/uL   Lymphocytes Relative 27 %   Lymphs Abs 1.0 0.7 - 4.0 K/uL   Monocytes Relative 9  %   Monocytes Absolute 0.3 0.1 - 1.0 K/uL   Eosinophils Relative 4 %   Eosinophils Absolute 0.2 0.0 - 0.7 K/uL   Basophils Relative 1 %   Basophils Absolute 0.0 0.0 - 0.1 K/uL  Comprehensive metabolic panel     Status: Abnormal   Collection Time: 11/08/16  6:06 AM  Result Value Ref Range   Sodium 136 135 - 145 mmol/L   Potassium 4.5 3.5 - 5.1 mmol/L   Chloride 103 101 - 111 mmol/L   CO2 26 22 - 32 mmol/L   Glucose, Bld 87 65 - 99 mg/dL   BUN 28 (H) 6 - 20 mg/dL   Creatinine, Ser 9.48 0.61 - 1.24 mg/dL   Calcium 8.9 8.9 - 01.6 mg/dL   Total Protein 6.6 6.5 - 8.1 g/dL   Albumin 4.0 3.5 - 5.0 g/dL   AST 23 15 - 41 U/L   ALT 15 (L) 17 - 63 U/L   Alkaline Phosphatase 61 38 - 126 U/L   Total Bilirubin 0.6 0.3 - 1.2 mg/dL   GFR calc non Af Amer >60 >60 mL/min   GFR calc Af Amer >60 >60 mL/min   Anion gap 7 5 - 15  Lipase, blood     Status: None   Collection Time: 11/08/16  6:06 AM  Result Value Ref Range   Lipase 22 11 - 51 U/L   Imaging Studies: No results found.  ED COURSE  Nursing notes and initial vitals signs, including pulse oximetry, reviewed.  Vitals:   11/08/16 0500 11/08/16 0515 11/08/16 0530 11/08/16 0545  BP: 106/70 99/70 108/69 106/70  Pulse: (!) 39 (!) 37 (!) 36 (!) 48  Resp: 15 12 11  (!) 22  SpO2: 98% 97% 99% 100%  Weight:      Height:       6:41 AM We'll schedule patient for outpatient ultrasound. Patient was given Zofran and Protonix for treatment of possible gastritis or GERD.  PROCEDURES    ED DIAGNOSES     ICD-10-CM   1. Epigastric pain R10.13        Paula Libra, MD 11/08/16 [phone]

## 2016-11-09 ENCOUNTER — Encounter (HOSPITAL_COMMUNITY): Payer: Self-pay | Admitting: Emergency Medicine

## 2016-11-09 ENCOUNTER — Emergency Department (HOSPITAL_COMMUNITY): Payer: Non-veteran care

## 2016-11-09 ENCOUNTER — Emergency Department (HOSPITAL_COMMUNITY)
Admission: EM | Admit: 2016-11-09 | Discharge: 2016-11-09 | Disposition: A | Discharge status: Home / Self Care | Payer: Non-veteran care | Attending: Emergency Medicine | Admitting: Emergency Medicine

## 2016-11-09 DIAGNOSIS — R1084 Generalized abdominal pain: Secondary | ICD-10-CM | POA: Diagnosis not present

## 2016-11-09 DIAGNOSIS — F329 Major depressive disorder, single episode, unspecified: Secondary | ICD-10-CM | POA: Diagnosis not present

## 2016-11-09 DIAGNOSIS — Z79899 Other long term (current) drug therapy: Secondary | ICD-10-CM | POA: Insufficient documentation

## 2016-11-09 DIAGNOSIS — K921 Melena: Secondary | ICD-10-CM | POA: Insufficient documentation

## 2016-11-09 DIAGNOSIS — R0789 Other chest pain: Secondary | ICD-10-CM | POA: Insufficient documentation

## 2016-11-09 DIAGNOSIS — R112 Nausea with vomiting, unspecified: Secondary | ICD-10-CM | POA: Insufficient documentation

## 2016-11-09 DIAGNOSIS — R079 Chest pain, unspecified: Secondary | ICD-10-CM | POA: Diagnosis present

## 2016-11-09 DIAGNOSIS — F419 Anxiety disorder, unspecified: Secondary | ICD-10-CM | POA: Diagnosis not present

## 2016-11-09 DIAGNOSIS — R109 Unspecified abdominal pain: Secondary | ICD-10-CM

## 2016-11-09 LAB — URINALYSIS, ROUTINE W REFLEX MICROSCOPIC
Bilirubin Urine: NEGATIVE
Glucose, UA: NEGATIVE mg/dL
Hgb urine dipstick: NEGATIVE
Ketones, ur: NEGATIVE mg/dL
Leukocytes, UA: NEGATIVE
Nitrite: NEGATIVE
Protein, ur: NEGATIVE mg/dL
Specific Gravity, Urine: 1.005 (ref 1.005–1.030)
pH: 7 (ref 5.0–8.0)

## 2016-11-09 LAB — BASIC METABOLIC PANEL
ANION GAP: 4 — AB (ref 5–15)
BUN: 18 mg/dL (ref 6–20)
CO2: 26 mmol/L (ref 22–32)
CREATININE: 0.98 mg/dL (ref 0.61–1.24)
Calcium: 9.1 mg/dL (ref 8.9–10.3)
Chloride: 105 mmol/L (ref 101–111)
GFR calc Af Amer: >60 mL/min (ref >60)
GLUCOSE: 86 mg/dL (ref 65–99)
Potassium: 4.5 mmol/L (ref 3.5–5.1)
Sodium: 135 mmol/L (ref 135–145)

## 2016-11-09 LAB — TYPE AND SCREEN
ABO/RH(D): O POS
Antibody Screen: NEGATIVE

## 2016-11-09 LAB — HEPATIC FUNCTION PANEL
ALBUMIN: 3.9 g/dL (ref 3.5–5.0)
ALT: 16 U/L — AB (ref 17–63)
AST: 20 U/L (ref 15–41)
Alkaline Phosphatase: 58 U/L (ref 38–126)
Bilirubin, Direct: <0.1 mg/dL — ABNORMAL LOW (ref 0.1–0.5)
TOTAL PROTEIN: 6.9 g/dL (ref 6.5–8.1)
Total Bilirubin: 0.6 mg/dL (ref 0.3–1.2)

## 2016-11-09 LAB — CBC
HCT: 37.2 % — ABNORMAL LOW (ref 39.0–52.0)
Hemoglobin: 12.9 g/dL — ABNORMAL LOW (ref 13.0–17.0)
MCH: 34.2 pg — AB (ref 26.0–34.0)
MCHC: 34.7 g/dL (ref 30.0–36.0)
MCV: 98.7 fL (ref 78.0–100.0)
PLATELETS: 207 10*3/uL (ref 150–400)
RBC: 3.77 MIL/uL — ABNORMAL LOW (ref 4.22–5.81)
RDW: 13.1 % (ref 11.5–15.5)
WBC: 4.6 10*3/uL (ref 4.0–10.5)

## 2016-11-09 LAB — POC OCCULT BLOOD, ED: FECAL OCCULT BLD: NEGATIVE

## 2016-11-09 LAB — I-STAT TROPONIN, ED: Troponin i, poc: 0 ng/mL (ref 0.00–0.08)

## 2016-11-09 LAB — LIPASE, BLOOD: LIPASE: 23 U/L (ref 11–51)

## 2016-11-09 LAB — ABO/RH: ABO/RH(D): O POS

## 2016-11-09 MED ORDER — ONDANSETRON HCL 4 MG PO TABS: 4.0000 mg | ORAL_TABLET | Freq: Four times a day (QID) | ORAL | 0 refills | Status: DC

## 2016-11-09 MED ORDER — ONDANSETRON HCL 4 MG/2ML IJ SOLN
4.0000 mg | Freq: Once | INTRAMUSCULAR | Status: AC
  Administered 2016-11-09: 4 mg via INTRAVENOUS
  Filled 2016-11-09: qty 2

## 2016-11-09 MED ORDER — OXYCODONE-ACETAMINOPHEN 5-325 MG PO TABS: 1.0000 | ORAL_TABLET | Freq: Four times a day (QID) | ORAL | 0 refills | Status: DC | PRN

## 2016-11-09 MED ORDER — DOCUSATE SODIUM 250 MG PO CAPS: 250.0000 mg | ORAL_CAPSULE | Freq: Every day | ORAL | 0 refills | Status: DC

## 2016-11-09 MED ORDER — SODIUM CHLORIDE 0.9 % IV BOLUS (SEPSIS)
1000.0000 mL | Freq: Once | INTRAVENOUS | Status: AC
  Administered 2016-11-09: 1000 mL via INTRAVENOUS

## 2016-11-09 MED ORDER — IOPAMIDOL (ISOVUE-300) INJECTION 61%
INTRAVENOUS | Status: AC
  Administered 2016-11-09: 100 mL
  Filled 2016-11-09: qty 100

## 2016-11-09 MED ORDER — MORPHINE SULFATE (PF) 4 MG/ML IV SOLN
4.0000 mg | Freq: Once | INTRAVENOUS | Status: AC
  Administered 2016-11-09: 4 mg via INTRAVENOUS
  Filled 2016-11-09: qty 1

## 2016-11-09 NOTE — ED Notes (Signed)
After triaging, the patient needed to urinate but since he was feeling weak, RN gave him a urinal.  Wife was in the room with the patient while he was using the urinal.  RN was in another triage room, came back and saw personnel in with patient.  Wife said he stood up to use urinal and he felt faint, so he lowered himself to the floor.  No further action taken.  Charge nurse notified.

## 2016-11-09 NOTE — Discharge Instructions (Signed)
Medications: Percocet, Zofran, Colace  Treatment: Take 1-2 Percocet every 4-6 hours as needed for severe pain. Do not drive or operative machinery when taking this medication. Take Zofran every 6 hours as needed for nausea or vomiting. Take Colace once daily to help prevent constipation.  Do not drink alcohol, drive, operate machinery or participate in any other potentially dangerous activities while taking opiate pain medication as it may make you sleepy. Do not take this medication with any other sedating medications, either prescription or over-the-counter. If you were prescribed Percocet or Vicodin, do not take these with acetaminophen (Tylenol) as it is already contained within these medications and overdose of Tylenol is dangerous.   This medication is an opiate (or narcotic) pain medication and can be habit forming.  Use it as little as possible to achieve adequate pain control.  Do not use or use it with extreme caution if you have a history of opiate abuse or dependence. This medication is intended for your use only - do not give any to anyone else and keep it in a secure place where nobody else, especially children, have access to it. It will also cause or worsen constipation, so you may want to consider taking an over-the-counter stool softener while you are taking this medication.   Follow-up: Please follow-up with a gastroenterologist as soon as possible for further evaluation and colonoscopy/endoscopy. Please return to emergency department if you develop any new or worsening symptoms.

## 2016-11-09 NOTE — ED Provider Notes (Signed)
[facility]-EMERGENCY DEPT Provider Note   CSN: [id number] Arrival date & time: 11/09/16  1242     History   Chief Complaint Chief Complaint  Patient presents with  . Chest Pain  . Melena    HPI Erik Olson is a 56 y.o. male who presents with a one-week history of intermittent chest pressure and epigastric abdominal pain. Patient has been evaluated twice in the past week. His symptoms. He was admitted to the hospital for 12 hours and have a negative stress test. 2 days ago, patient went to the [facility] and had an ultrasound which was negative. He was told to follow up with PCP for a HIDA scan if he continues to have pain. Patient comes today because he developed worsening pain, nausea, vomiting, and dark stool, bloody stool. His pain is worse after eating. He denies any bloody emesis or coffee ground emesis. Patient and wife reports he passed out in the waiting room today prior to my evaluation. Patient denies any chest pain at this time. He denies any shortness of breath. Patient does note he urinates a lot, however denies any other urinary symptoms. Patient works out every morning to help his PTSD. Patient takes gabapentin and Xanax, but denies taking any other medications for his symptoms. Patient notes that he has used smokeless tobacco and swallowed the juice for several years. Patient does note that he has lost over 20 pounds in the past few months.  HPI  Past Medical History:  Diagnosis Date  . Anxiety   . Bradycardia   . Chest pain   . Depression   . Narrowing of intervertebral disc space    C6-7  . Numbness and tingling in right hand   . PTSD (post-traumatic stress disorder)     Patient Active Problem List   Diagnosis Date Noted  . Narrowing of intervertebral disc space 11/04/2016  . Anemia 11/03/2016  . Chest pain 11/03/2016  . Numbness and tingling in right hand 11/03/2016  . Bradycardia 11/03/2016  . PTSD (post-traumatic stress disorder)   . Normochromic  normocytic anemia   . Gastroenteritis 09/03/2010  . ACUTE PHARYNGITIS 05/29/2009  . FATIGUE 05/29/2009  . DIZZINESS 01/30/2009  . CHEST PAIN UNSPECIFIED 01/30/2009  . Alcohol abuse 06/29/2008  . SYNCOPE 06/29/2008  . Anxiety state 01/26/2007  . DEPRESSION 12/31/2006    Past Surgical History:  Procedure Laterality Date  . WISDOM TOOTH EXTRACTION         Home Medications    Prior to Admission medications   Medication Sig Start Date End Date Taking? Authorizing Provider  ALPRAZolam Prudy Feeler) 0.5 MG tablet Take 0.5 mg by mouth every 4 (four) hours.     Yes [provider]  gabapentin (NEURONTIN) 300 MG capsule Take 300 mg by mouth 4 (four) times daily.     Yes [provider]  naproxen sodium (ANAPROX) 220 MG tablet Take 220 mg by mouth daily as needed (pain).   Yes [provider]  ranitidine (ZANTAC) 150 MG tablet Take 150 mg by mouth daily.   Yes [provider]  docusate sodium (COLACE) 250 MG capsule Take 1 capsule (250 mg total) by mouth daily. 11/09/16   Particia Strahm, Waylan Boga, PA-C  ondansetron (ZOFRAN) 4 MG tablet Take 1 tablet (4 mg total) by mouth every 6 (six) hours. 11/09/16   Emi Holes, PA-C  oxyCODONE-acetaminophen (PERCOCET/ROXICET) 5-325 MG tablet Take 1-2 tablets by mouth every 6 (six) hours as needed for severe pain. 11/09/16  Emi Holes, PA-C    Family History Family History  Problem Relation Age of Onset  . Breast cancer Mother   . Suicidality Brother     Social History Social History  Substance Use Topics  . Smoking status: Never Smoker  . Smokeless tobacco: Current User    Types: Snuff  . Alcohol use No     Allergies   Penicillins   Review of Systems Review of Systems  Constitutional: Negative for chills and fever.  HENT: Negative for facial swelling and sore throat.   Respiratory: Negative for shortness of breath.   Cardiovascular: Positive for chest pain.  Gastrointestinal: Positive for abdominal  pain, blood in stool, diarrhea, nausea and vomiting.  Genitourinary: Positive for frequency. Negative for dysuria.  Musculoskeletal: Negative for back pain.  Skin: Negative for rash and wound.  Neurological: Negative for headaches.  Psychiatric/Behavioral: The patient is not nervous/anxious.      Physical Exam Updated Vital Signs BP 122/78   Pulse (!) 43   Temp 98.4 F (36.9 C) (Oral)   Resp 13   Ht 6' (1.829 m)   Wt 83.9 kg (185 lb)   SpO2 100%   BMI 25.09 kg/m   Physical Exam  Constitutional: He appears well-developed and well-nourished. No distress.  HENT:  Head: Normocephalic and atraumatic.  Mouth/Throat: Oropharynx is clear and moist. No oropharyngeal exudate.  Eyes: Pupils are equal, round, and reactive to light. Conjunctivae are normal. Right eye exhibits no discharge. Left eye exhibits no discharge. No scleral icterus.  Neck: Normal range of motion. Neck supple. No thyromegaly present.  Cardiovascular: Regular rhythm, normal heart sounds and intact distal pulses.  Exam reveals no gallop and no friction rub.   No murmur heard. Patient with baseline bradycardia- very physically fit  Pulmonary/Chest: Effort normal and breath sounds normal. No stridor. No respiratory distress. He has no wheezes. He has no rales.  Abdominal: Soft. Bowel sounds are normal. He exhibits no distension. There is tenderness in the right upper quadrant, right lower quadrant, epigastric area and left lower quadrant. There is tenderness at McBurney's point and positive Murphy's sign. There is no rebound and no guarding.  Musculoskeletal: He exhibits no edema.  Lymphadenopathy:    He has no cervical adenopathy.  Neurological: He is alert. Coordination normal.  Skin: Skin is warm and dry. No rash noted. He is not diaphoretic. No pallor.  Psychiatric: He has a normal mood and affect.  Nursing note and vitals reviewed.    ED Treatments / Results  Labs (all labs ordered are listed, but only  abnormal results are displayed) Labs Reviewed  BASIC METABOLIC PANEL - Abnormal; Notable for the following:       Result Value   Anion gap 4 (*)    All other components within normal limits  CBC - Abnormal; Notable for the following:    RBC 3.77 (*)    Hemoglobin 12.9 (*)    HCT 37.2 (*)    MCH 34.2 (*)    All other components within normal limits  HEPATIC FUNCTION PANEL - Abnormal; Notable for the following:    ALT 16 (*)    Bilirubin, Direct <0.1 (*)    All other components within normal limits  URINALYSIS, ROUTINE W REFLEX MICROSCOPIC - Abnormal; Notable for the following:    Color, Urine STRAW (*)    All other components within normal limits  LIPASE, BLOOD  I-STAT TROPONIN, ED  POC OCCULT BLOOD, ED  TYPE AND SCREEN  ABO/RH  EKG  EKG Interpretation  Date/Time:  Sunday November 09 2016 12:53:49 EDT Ventricular Rate:  58 PR Interval:  200 QRS Duration: 106 QT Interval:  412 QTC Calculation: 404 R Axis:   38 Text Interpretation:  Sinus bradycardia Nonspecific T wave abnormality Abnormal ECG Confirmed by Rolland Porter ([id number]) on 11/09/2016 1:02:59 PM       Radiology US Abdomen Complete  Result Date: 11/08/2016 CLINICAL DATA:  Upper abdominal pain EXAM: ABDOMEN ULTRASOUND COMPLETE COMPARISON:  10/07/2016 FINDINGS: Gallbladder: No gallstones or wall thickening visualized. No sonographic Murphy sign noted by sonographer. Common bile duct: Diameter: 2 mm Liver: No focal lesion identified. Within normal limits in parenchymal echogenicity. Portal vein is patent on color Doppler imaging with normal direction of blood flow towards the liver. IVC: No abnormality visualized. Pancreas: Visualized portion unremarkable. Spleen: Size and appearance within normal limits. Right Kidney: Length: 10.5 cm. Echogenicity within normal limits. No mass or hydronephrosis visualized. Left Kidney: Length: 12.8 cm. Echogenicity within normal limits. No mass or hydronephrosis visualized. Abdominal aorta:  No aneurysm visualized. Other findings: No free fluid or ascites IMPRESSION: No acute or significant finding by abdominal ultrasound Electronically Signed   By: Judie Petit.  Shick M.D.   On: 11/08/2016 10:24   Ct Abdomen Pelvis W Contrast  Result Date: 11/09/2016 CLINICAL DATA:  Abdominal pain and bloody stools. Nausea vomiting and diarrhea. EXAM: CT ABDOMEN AND PELVIS WITH CONTRAST TECHNIQUE: Multidetector CT imaging of the abdomen and pelvis was performed using the standard protocol following bolus administration of intravenous contrast. CONTRAST:  ISOVUE-300 IOPAMIDOL (ISOVUE-300) INJECTION 61% COMPARISON:  Abdominal series radiograph 11/09/2016, CT of the abdomen pelvis 10/07/2016 FINDINGS: Lower chest: No acute abnormality. Hepatobiliary: No focal liver abnormality is seen. No gallstones, gallbladder wall thickening, or biliary dilatation. Pancreas: Unremarkable. No pancreatic ductal dilatation or surrounding inflammatory changes. Spleen: Normal in size without focal abnormality. Adrenals/Urinary Tract: Adrenal glands are unremarkable. Kidneys are normal, without renal calculi, focal lesion, or hydronephrosis. Bladder is unremarkable. Stomach/Bowel: Stomach is within normal limits. The appendix is not identified with certainty but there are no secondary signs of appendicitis. No evidence of bowel distention, or inflammatory changes. There is a asymmetric mucosal thickening of the rectum, image 92/100, series 3 and 90 3/151, series 6. Vascular/Lymphatic: No significant vascular findings are present. No enlarged abdominal or pelvic lymph nodes. Reproductive: Prostate is unremarkable. Other: No abdominal wall hernia or abnormality. No abdominopelvic ascites. Musculoskeletal: No acute or significant osseous findings. IMPRESSION: No evidence of acute abnormalities within the solid abdominal organs. Mucosal thickening of the rectum, which may represent hemorrhoids, inflammatory changes or potentially rectal mass.  Please correlate to clinical exam and colonoscopy. Electronically Signed   By: Ted Mcalpine M.D.   On: 11/09/2016 17:09   Dg Abd Acute W/chest  Result Date: 11/09/2016 CLINICAL DATA:  Abdominal pain EXAM: DG ABDOMEN ACUTE W/ 1V CHEST COMPARISON:  10/07/2016 abdominal CT FINDINGS: Normal bowel gas pattern. No excessive stool retention or rectal impaction. The urinary bladder is likely distended. Calcifications in the pelvis, left more than right, correlate with phleboliths on 2018 abdominal CT. No pneumoperitoneum. There is no edema, consolidation, effusion, or pneumothorax. Normal heart size and mediastinal contours. EKG leads create artifact over the chest. IMPRESSION: Negative acute abdominal series. Electronically Signed   By: Marnee Spring M.D.   On: 11/09/2016 15:10    Procedures Procedures (including critical care time)  Medications Ordered in ED Medications  sodium chloride 0.9 % bolus 1,000 mL (0 mLs Intravenous Stopped 11/09/16 1723)  morphine 4 MG/ML injection 4 mg (4 mg Intravenous Given 11/09/16 1556)  ondansetron (ZOFRAN) injection 4 mg (4 mg Intravenous Given 11/09/16 1555)  iopamidol (ISOVUE-300) 61 % injection (100 mLs  Contrast Given 11/09/16 1645)     Initial Impression / Assessment and Plan / ED Course  I have reviewed the triage vital signs and the nursing notes.  Pertinent labs & imaging results that were available during my care of the patient were reviewed by me and considered in my medical decision making (see chart for details).     Patient with mucosal thickening in the rectum on CT scan. CBC shows hemoglobin 12.9. BMP, hepatic function panel unremarkable. Lipase 23. UA negative. Troponin negative. Patient already had workup this week for chest pain, and patient presenting for abdominal pain. Patient with negative guaiac here in the ED, however complained of dark stool and bloody stool. Concerning finding on CT indicating need for colonoscopy. Patient given  these results and advised to follow up with PCP or gastroenterology as soon as possible for colonoscopy and possibly a HIDA scan. Return precautions discussed. Patient understands and agrees with plan. Patient discharged home with a short course of Percocet for abdominal pain, Zofran, Colace. I reviewed the [place] narcotic database and found the discrepancies. Patient's vitals stable throughout ED course discharged in satisfactory condition. Patient also evaluated by Dr. Verdie Mosher who guided the patient's management and agrees with plan.  Final Clinical Impressions(s) / ED Diagnoses   Final diagnoses:  Atypical chest pain  Generalized abdominal pain    New Prescriptions Discharge Medication List as of 11/09/2016  6:29 PM    START taking these medications   Details  docusate sodium (COLACE) 250 MG capsule Take 1 capsule (250 mg total) by mouth daily., Starting Sun 11/09/2016, Print    ondansetron (ZOFRAN) 4 MG tablet Take 1 tablet (4 mg total) by mouth every 6 (six) hours., Starting Sun 11/09/2016, Print    oxyCODONE-acetaminophen (PERCOCET/ROXICET) 5-325 MG tablet Take 1-2 tablets by mouth every 6 (six) hours as needed for severe pain., Starting Sun 11/09/2016, Print         Ramani Riva, [place], PA-C 11/10/16 0110    Lavera Guise, MD 11/10/16 [phone]

## 2016-11-09 NOTE — ED Triage Notes (Addendum)
The patient has been having chest pressure had stress test done Monday here at [facility]  Chest pressure continued along with abdominal pain and they went to [facility] and had an US showed.  They were told he needed to have a scan to show function.  The patient then started having Nausea/Vomiting and weakness this morning.  He rates his pain in his abdomen 10/10 and chest pressure 6/10.  He has taken his Xannax 0830  and his Gabapentin 0900.  The patient also had a BM that was dark and tarry.

## 2016-11-09 NOTE — ED Provider Notes (Signed)
Medical screening examination/treatment/procedure(s) were conducted as a shared visit with non-physician practitioner(s) and myself.  I personally evaluated the patient during the encounter.   EKG Interpretation  Date/Time:  Sunday November 09 2016 12:53:49 EDT Ventricular Rate:  58 PR Interval:  200 QRS Duration: 106 QT Interval:  412 QTC Calculation: 404 R Axis:   38 Text Interpretation:  Sinus bradycardia Nonspecific T wave abnormality Abnormal ECG Confirmed by James, Mark ([id number]) on 11/09/2016 1:02:59 PM      56  year old male who presents with upper abdominal pain. Patient was seen in the emergency department over one week ago for chest pain while exercising. He had a stress test that did not show significant ischemia. He was discharged on Monday from the hospital. States that he went about his usual exercises without difficulty, but then had severe abdominal pain and chest pressure yesterday while exercising again. Was seen in the emergency Department again and had a right upper quadrant ultrasound that was unremarkable. Was told to follow-up with PCP for possible HIDA scan.  States that since then he has had severe upper abdominal pain that is worse after eating. Intermittently over the past week has noted dark and almost black stools. Had an episode of vomiting earlier today that was not bloody or coffee ground in nature. Denies any fevers or chills or any urinary complaints. Has noted symptoms of weight loss that is unintentional recently and having diffuse chills  In ED, patient is nontoxic and in no acute distress. Has sinus bradycardia, which is baseline for him. Vital signs otherwise stable. He is diffusely tender abdomen that is worse over the right hemiabdomen. Records are reviewed including recent admission and ED visits. Right upper quadrant ultrasound yesterday does not show any gallstones, gallbladder sludge, or any acute hepatobiliary processes. Given history of dark stools and  postprandial abdominal pain, history may also be suggestive of possible peptic ulcer disease. We will obtain CT abdomen and pelvis to rule out other acute intra-abdominal processes. I suspect that he likely will require GI follow-up for endoscopy/colonoscopy as well. guaic negative stool, stable anemia.  CT visualized. With thickening of the rectum that could represent hemorrhoids versus inflammation versus mass. No other acute intra-abdominal processes. Patient will be given GI referral for colonoscopy/endoscopy and ongoing workup. Strict return and follow-up instructions reviewed. He expressed understanding of all discharge instructions and felt comfortable with the plan of care. Lavera Guise, MD 11/09/16 [phone]

## 2016-11-09 NOTE — ED Notes (Signed)
Pt laying in the floor in room, 2 other staff members helping pt.  Pt helped up on to triage chair/stretcher.  visitor tells [place] that pt was standing using urinal and began feeling unwell so he placed himself on the floor.  No fall per visitor, no evidence of injury

## 2016-11-13 ENCOUNTER — Emergency Department (HOSPITAL_BASED_OUTPATIENT_CLINIC_OR_DEPARTMENT_OTHER): Payer: Non-veteran care

## 2016-11-13 ENCOUNTER — Emergency Department (HOSPITAL_BASED_OUTPATIENT_CLINIC_OR_DEPARTMENT_OTHER)
Admission: EM | Admit: 2016-11-13 | Discharge: 2016-11-13 | Disposition: A | Discharge status: Home / Self Care | Payer: Non-veteran care | Attending: Emergency Medicine | Admitting: Emergency Medicine

## 2016-11-13 ENCOUNTER — Encounter (HOSPITAL_BASED_OUTPATIENT_CLINIC_OR_DEPARTMENT_OTHER): Payer: Self-pay

## 2016-11-13 DIAGNOSIS — F17220 Nicotine dependence, chewing tobacco, uncomplicated: Secondary | ICD-10-CM | POA: Insufficient documentation

## 2016-11-13 DIAGNOSIS — R1084 Generalized abdominal pain: Secondary | ICD-10-CM | POA: Insufficient documentation

## 2016-11-13 DIAGNOSIS — K629 Disease of anus and rectum, unspecified: Secondary | ICD-10-CM | POA: Insufficient documentation

## 2016-11-13 DIAGNOSIS — Z79899 Other long term (current) drug therapy: Secondary | ICD-10-CM | POA: Diagnosis not present

## 2016-11-13 LAB — COMPREHENSIVE METABOLIC PANEL
ALBUMIN: 3.8 g/dL (ref 3.5–5.0)
ALK PHOS: 62 U/L (ref 38–126)
ALT: 14 U/L — ABNORMAL LOW (ref 17–63)
ANION GAP: 4 — AB (ref 5–15)
AST: 20 U/L (ref 15–41)
BUN: 23 mg/dL — ABNORMAL HIGH (ref 6–20)
CHLORIDE: 105 mmol/L (ref 101–111)
CO2: 28 mmol/L (ref 22–32)
Calcium: 8.8 mg/dL — ABNORMAL LOW (ref 8.9–10.3)
Creatinine, Ser: 1.15 mg/dL (ref 0.61–1.24)
GFR calc Af Amer: >60 mL/min (ref >60)
GFR calc non Af Amer: >60 mL/min (ref >60)
Glucose, Bld: 91 mg/dL (ref 65–99)
POTASSIUM: 4.3 mmol/L (ref 3.5–5.1)
SODIUM: 137 mmol/L (ref 135–145)
Total Bilirubin: 0.3 mg/dL (ref 0.3–1.2)
Total Protein: 6.7 g/dL (ref 6.5–8.1)

## 2016-11-13 LAB — CBC WITH DIFFERENTIAL/PLATELET
BASOS PCT: 1 %
Basophils Absolute: 0 10*3/uL (ref 0.0–0.1)
EOS ABS: 0.3 10*3/uL (ref 0.0–0.7)
Eosinophils Relative: 6 %
HCT: 36.7 % — ABNORMAL LOW (ref 39.0–52.0)
HEMOGLOBIN: 12.5 g/dL — AB (ref 13.0–17.0)
Lymphocytes Relative: 34 %
Lymphs Abs: 1.5 10*3/uL (ref 0.7–4.0)
MCH: 34.1 pg — ABNORMAL HIGH (ref 26.0–34.0)
MCHC: 34.1 g/dL (ref 30.0–36.0)
MCV: 100 fL (ref 78.0–100.0)
MONOS PCT: 9 %
Monocytes Absolute: 0.4 10*3/uL (ref 0.1–1.0)
NEUTROS PCT: 50 %
Neutro Abs: 2.2 10*3/uL (ref 1.7–7.7)
Platelets: 185 10*3/uL (ref 150–400)
RBC: 3.67 MIL/uL — ABNORMAL LOW (ref 4.22–5.81)
RDW: 12.4 % (ref 11.5–15.5)
WBC: 4.3 10*3/uL (ref 4.0–10.5)

## 2016-11-13 LAB — I-STAT CG4 LACTIC ACID, ED: Lactic Acid, Venous: 0.69 mmol/L (ref 0.5–1.9)

## 2016-11-13 LAB — LIPASE, BLOOD: Lipase: 35 U/L (ref 11–51)

## 2016-11-13 LAB — OCCULT BLOOD X 1 CARD TO LAB, STOOL: Fecal Occult Bld: NEGATIVE

## 2016-11-13 MED ORDER — PANTOPRAZOLE SODIUM 40 MG IV SOLR
INTRAVENOUS | Status: AC
  Filled 2016-11-13: qty 40

## 2016-11-13 MED ORDER — HYDROMORPHONE HCL 1 MG/ML IJ SOLN
1.0000 mg | Freq: Once | INTRAMUSCULAR | Status: AC
  Administered 2016-11-13: 1 mg via INTRAVENOUS
  Filled 2016-11-13: qty 1

## 2016-11-13 MED ORDER — GI COCKTAIL ~~LOC~~
30.0000 mL | Freq: Once | ORAL | Status: AC
  Administered 2016-11-13: 30 mL via ORAL
  Filled 2016-11-13: qty 30

## 2016-11-13 MED ORDER — PANTOPRAZOLE SODIUM 40 MG IV SOLR
40.0000 mg | Freq: Once | INTRAVENOUS | Status: AC
  Administered 2016-11-13: 40 mg via INTRAVENOUS
  Filled 2016-11-13: qty 40

## 2016-11-13 MED ORDER — IOPAMIDOL (ISOVUE-300) INJECTION 61%
100.0000 mL | Freq: Once | INTRAVENOUS | Status: AC | PRN
  Administered 2016-11-13: 100 mL via INTRAVENOUS

## 2016-11-13 MED ORDER — SODIUM CHLORIDE 0.9 % IV BOLUS (SEPSIS)
1000.0000 mL | Freq: Once | INTRAVENOUS | Status: AC
  Administered 2016-11-13: 1000 mL via INTRAVENOUS

## 2016-11-13 MED ORDER — ONDANSETRON HCL 4 MG/2ML IJ SOLN
4.0000 mg | Freq: Once | INTRAMUSCULAR | Status: AC
  Administered 2016-11-13: 4 mg via INTRAVENOUS
  Filled 2016-11-13: qty 2

## 2016-11-13 NOTE — Discharge Instructions (Signed)
TAKE 8 CAPFULS OF MIRALAX IN A 32 OUNCE GATORADE AND DRINK THE WHOLE BEVERAGE.  Also try a fleets enema °

## 2016-11-13 NOTE — ED Triage Notes (Signed)
Pt here POV with increased abdominal pain. Pt seen here and [place] several times over the last month with similar issues. Pt has a appt at [facility] for colonoscopy due to "possible rectal mass". Pt reports an increased amount of bright red blood in his stool. Pt has been taking percocet with no relief.

## 2016-11-13 NOTE — ED Notes (Signed)
ED Provider at bedside. 

## 2016-11-13 NOTE — ED Provider Notes (Signed)
[facility] Provider Note: [name], MD, FACEP  CSN: [id number] MRN: [id number] ARRIVAL: 11/13/16 at 0608 ROOM: MHOTF/OTF   CHIEF COMPLAINT  Abdominal Pain   HISTORY OF PRESENT ILLNESS  11/13/16 6:38 AM Erik Olson is a 56 y.o. male with a history of PTSD. He was admitted to [facility] on the 13th of this month with chest pain. A workup including a stress test was negative for cardiac ischemia. He was seen by myself on the 18th of this month for epigastric pain which began after working out at [employer] Workup was unremarkable including on unremarkable abdominal ultrasound. Symptoms persisted and he was seen again on the 19th of this month. A CT of the abdomen and pelvis was unremarkable except for some changes in the rectum consistent with hemorrhoids, inflammation or mass. He is scheduled for colonoscopy at [facility] in about a week's time. Over the past 2 days his pain has worsened despite taking Percocet which was prescribed on the 19th. He is now rating his pain as a 10 out of 10. It is constant and non-crampy. It is located in the abdomen generally, more prominent in the upper abdomen than the lower abdomen. With movement he also experiences a sharp pain in the right upper quadrant. There has been associated nausea and vomiting but no diarrhea. He has noticed first black stools and then blood in his stools the past 2 days. He denies chest pain.   Past Medical History:  Diagnosis Date  . Anxiety   . Bradycardia   . Chest pain   . Depression   . Narrowing of intervertebral disc space    C6-7  . Numbness and tingling in right hand   . PTSD (post-traumatic stress disorder)     Past Surgical History:  Procedure Laterality Date  . WISDOM TOOTH EXTRACTION      Family History  Problem Relation Age of Onset  . Breast cancer Mother   . Suicidality Brother     Social History  Substance Use Topics  . Smoking status: Never Smoker  . Smokeless tobacco:  Current User    Types: Snuff  . Alcohol use No    Prior to Admission medications   Medication Sig Start Date End Date Taking? Authorizing Provider  ALPRAZolam Prudy Feeler) 0.5 MG tablet Take 0.5 mg by mouth every 4 (four) hours.      [provider]  docusate sodium (COLACE) 250 MG capsule Take 1 capsule (250 mg total) by mouth daily. 11/09/16   Law, Waylan Boga, PA-C  gabapentin (NEURONTIN) 300 MG capsule Take 300 mg by mouth 4 (four) times daily.      [provider]  naproxen sodium (ANAPROX) 220 MG tablet Take 220 mg by mouth daily as needed (pain).    [provider]  ondansetron (ZOFRAN) 4 MG tablet Take 1 tablet (4 mg total) by mouth every 6 (six) hours. 11/09/16   Emi Holes, PA-C  oxyCODONE-acetaminophen (PERCOCET/ROXICET) 5-325 MG tablet Take 1-2 tablets by mouth every 6 (six) hours as needed for severe pain. 11/09/16   Law, Waylan Boga, PA-C  ranitidine (ZANTAC) 150 MG tablet Take 150 mg by mouth daily.    [provider]    Allergies Penicillins   REVIEW OF SYSTEMS  Negative except as noted here or in the History of Present Illness.   PHYSICAL EXAMINATION  Initial Vital Signs Blood pressure 112/69, pulse (!) 55, temperature 98.7 F (37.1 C), temperature source Oral, resp. rate  18, height 6' (1.829 m), weight 81.2 kg (179 lb), SpO2 99 %.  Examination General: Well-developed, well-nourished male in no acute distress but appears uncomfortable; appearance consistent with age of record HENT: normocephalic; atraumatic Eyes: pupils equal, round and reactive to light; extraocular muscles intact Neck: supple Heart: regular rate and rhythm; bradycardia Lungs: clear to auscultation bilaterally Abdomen: soft; nondistended; diffusely tender most prominent in the epigastrium; no masses or hepatosplenomegaly; bowel sounds present Extremities: No deformity; full range of motion; pulses normal Neurologic: Awake, alert; motor function intact in all  extremities and symmetric; no facial droop Skin: Warm and dry Psychiatric: Grimacing   RESULTS  Summary of this visit's results, reviewed by myself:   EKG Interpretation  Date/Time:    Ventricular Rate:    PR Interval:    QRS Duration:   QT Interval:    QTC Calculation:   R Axis:     Text Interpretation:        Laboratory Studies: Results for orders placed or performed during the hospital encounter of 11/13/16 (from the past 24 hour(s))  CBC with Differential/Platelet     Status: Abnormal   Collection Time: 11/13/16  6:50 AM  Result Value Ref Range   WBC 4.3 4.0 - 10.5 K/uL   RBC 3.67 (L) 4.22 - 5.81 MIL/uL   Hemoglobin 12.5 (L) 13.0 - 17.0 g/dL   HCT 96.0 (L) 45.4 - 09.8 %   MCV 100.0 78.0 - 100.0 fL   MCH 34.1 (H) 26.0 - 34.0 pg   MCHC 34.1 30.0 - 36.0 g/dL   RDW 11.9 14.7 - 82.9 %   Platelets 185 150 - 400 K/uL   Neutrophils Relative % 50 %   Neutro Abs 2.2 1.7 - 7.7 K/uL   Lymphocytes Relative 34 %   Lymphs Abs 1.5 0.7 - 4.0 K/uL   Monocytes Relative 9 %   Monocytes Absolute 0.4 0.1 - 1.0 K/uL   Eosinophils Relative 6 %   Eosinophils Absolute 0.3 0.0 - 0.7 K/uL   Basophils Relative 1 %   Basophils Absolute 0.0 0.0 - 0.1 K/uL  Comprehensive metabolic panel     Status: Abnormal   Collection Time: 11/13/16  6:50 AM  Result Value Ref Range   Sodium 137 135 - 145 mmol/L   Potassium 4.3 3.5 - 5.1 mmol/L   Chloride 105 101 - 111 mmol/L   CO2 28 22 - 32 mmol/L   Glucose, Bld 91 65 - 99 mg/dL   BUN 23 (H) 6 - 20 mg/dL   Creatinine, Ser 5.62 0.61 - 1.24 mg/dL   Calcium 8.8 (L) 8.9 - 10.3 mg/dL   Total Protein 6.7 6.5 - 8.1 g/dL   Albumin 3.8 3.5 - 5.0 g/dL   AST 20 15 - 41 U/L   ALT 14 (L) 17 - 63 U/L   Alkaline Phosphatase 62 38 - 126 U/L   Total Bilirubin 0.3 0.3 - 1.2 mg/dL   GFR calc non Af Amer >60 >60 mL/min   GFR calc Af Amer >60 >60 mL/min   Anion gap 4 (L) 5 - 15  Lipase, blood     Status: None   Collection Time: 11/13/16  6:50 AM  Result Value  Ref Range   Lipase 35 11 - 51 U/L  Occult blood card to lab, stool Provider will collect     Status: None   Collection Time: 11/13/16  6:55 AM  Result Value Ref Range   Fecal Occult Bld NEGATIVE NEGATIVE  I-Stat CG4  Lactic Acid, ED     Status: None   Collection Time: 11/13/16  7:02 AM  Result Value Ref Range   Lactic Acid, Venous 0.69 0.5 - 1.9 mmol/L   Imaging Studies: Ct Abdomen Pelvis W Contrast  Result Date: 11/13/2016 CLINICAL DATA:  Right-sided abdominal pain and blood in stool for 2 weeks. EXAM: CT ABDOMEN AND PELVIS WITH CONTRAST TECHNIQUE: Multidetector CT imaging of the abdomen and pelvis was performed using the standard protocol following bolus administration of intravenous contrast. CONTRAST:  ISOVUE-300 IOPAMIDOL (ISOVUE-300) INJECTION 61% COMPARISON:  11/09/2016 FINDINGS: Lower Chest: No acute findings. Hepatobiliary: No hepatic masses identified. Gallbladder is unremarkable. Pancreas:  No mass or inflammatory changes. Spleen: Within normal limits in size and appearance. Adrenals/Urinary Tract: No masses identified. No evidence of hydronephrosis. Stomach/Bowel: Large colonic stool burden again noted. Subtle wall thickening again seen involving rectum, without significant change since prior study. This raises suspicion for proctitis, with rectal carcinoma considered less likely. No evidence of perirectal abscess. Vascular/Lymphatic: No pathologically enlarged lymph nodes. No abdominal aortic aneurysm. Reproductive:  No mass or other significant abnormality. Other:  None. Musculoskeletal:  No suspicious bone lesions identified. IMPRESSION: Subtle wall thickening of rectum again seen. This is suspicious for proctitis, with rectal carcinoma considered less likely. Suggest correlation with rectal exam and consideration of proctoscopy. No evidence of perirectal abscess . Large stool burden again noted; recommend clinical correlation for possible constipation. Electronically Signed   By:  Myles Rosenthal M.D.   On: 11/13/2016 08:59    ED COURSE  Nursing notes and initial vitals signs, including pulse oximetry, reviewed.  Vitals:   11/13/16 0704 11/13/16 0752 11/13/16 0800 11/13/16 0937  BP: 103/62 113/75 111/65 107/73  Pulse: (!) 45 62 (!) 55 (!) 53  Resp: 16 18 16 18   Temp:      TempSrc:      SpO2: 95% 100% 98% 99%  Weight:      Height:       6:59 AM Patient evaluated and orders entered into the computer. Dr. Adela Lank will follow up on results and make disposition.  PROCEDURES    ED DIAGNOSES     ICD-10-CM   1. Generalized abdominal pain R10.84   2. Rectal abnormality K62.9        Lissandra Keil, Jonny Ruiz, MD 11/13/16 1228

## 2016-11-13 NOTE — ED Provider Notes (Signed)
Received patient in signout from Dr. Rolley Sims, briefly patient is a 56 year old male with recurrent epigastric abdominal pain. He has been seen in the ED 4 times in the past week and half for this. He has had an ultrasound as well as a CT scan. The only finding was possible thickening of the rectum. He has an outpatient GI appointment but not until the next week.  He had what sounds like a normal HIDA scan with an EF of 41% per the family. The patient is concerned that he has diffuse cancer in his abdomen and is worried about the delay in diagnosis over the next week. Has some continued pain on my exam. Will give a GI cocktail, protonix.   After my repeat history and exam I suspect that this patient's symptoms are most likely reflux in nature. Patient has been taking only narcotics and no acid reducing agent. Symptoms are worse after eating and he has limited his oral intake.  CT scan with significant stool burden. Patient has continued inflammatory changes to the rectal mucosa. I discussed the results with the patient. We'll have him do a cleanout at home. Try and have him stay off of narcotic pain medicines. I discussed the case with Dr. Elnoria Howard to try and expedite workup as the patient is significantly anxious about his possible diagnosis of cancer to his rectum. Trial zantac as well for possible reflux symptoms.   9:35 AM:  I have discussed the diagnosis/risks/treatment options with the patient and family and believe the pt to be eligible for discharge home to follow-up with PCP, GI. We also discussed returning to the ED immediately if new or worsening sx occur. We discussed the sx which are most concerning (e.g., sudden worsening pain, fever, inability to tolerate by mouth) that necessitate immediate return. Medications administered to the patient during their visit and any new prescriptions provided to the patient are listed below.  Medications given during this visit Medications  sodium chloride 0.9 %  bolus 1,000 mL (0 mLs Intravenous Stopped 11/13/16 0752)  ondansetron (ZOFRAN) injection 4 mg (4 mg Intravenous Given 11/13/16 0658)  HYDROmorphone (DILAUDID) injection 1 mg (1 mg Intravenous Given 11/13/16 0658)  gi cocktail (Maalox,Lidocaine,Donnatal) (30 mLs Oral Given 11/13/16 0750)  pantoprazole (PROTONIX) injection 40 mg (40 mg Intravenous Given 11/13/16 0750)  HYDROmorphone (DILAUDID) injection 1 mg (1 mg Intravenous Given 11/13/16 0756)  iopamidol (ISOVUE-300) 61 % injection 100 mL (100 mLs Intravenous Contrast Given 11/13/16 0826)     The patient appears reasonably screen and/or stabilized for discharge and I doubt any other medical condition or other [facility] requiring further screening, evaluation, or treatment in the ED at this time prior to discharge.     Melene Plan, DO 11/13/16 [phone]

## 2016-12-08 ENCOUNTER — Emergency Department (HOSPITAL_BASED_OUTPATIENT_CLINIC_OR_DEPARTMENT_OTHER)
Admission: EM | Admit: 2016-12-08 | Discharge: 2016-12-08 | Disposition: A | Discharge status: Home / Self Care | Payer: Non-veteran care | Attending: Emergency Medicine | Admitting: Emergency Medicine

## 2016-12-08 ENCOUNTER — Encounter (HOSPITAL_BASED_OUTPATIENT_CLINIC_OR_DEPARTMENT_OTHER): Payer: Self-pay | Admitting: *Deleted

## 2016-12-08 DIAGNOSIS — Z79899 Other long term (current) drug therapy: Secondary | ICD-10-CM | POA: Insufficient documentation

## 2016-12-08 DIAGNOSIS — F17290 Nicotine dependence, other tobacco product, uncomplicated: Secondary | ICD-10-CM | POA: Diagnosis not present

## 2016-12-08 DIAGNOSIS — R109 Unspecified abdominal pain: Secondary | ICD-10-CM | POA: Diagnosis not present

## 2016-12-08 DIAGNOSIS — R1084 Generalized abdominal pain: Secondary | ICD-10-CM | POA: Diagnosis present

## 2016-12-08 LAB — COMPREHENSIVE METABOLIC PANEL
ALBUMIN: 4.1 g/dL (ref 3.5–5.0)
ALT: 17 U/L (ref 17–63)
ANION GAP: 5 (ref 5–15)
AST: 23 U/L (ref 15–41)
Alkaline Phosphatase: 60 U/L (ref 38–126)
BUN: 16 mg/dL (ref 6–20)
CHLORIDE: 105 mmol/L (ref 101–111)
CO2: 26 mmol/L (ref 22–32)
Calcium: 9.2 mg/dL (ref 8.9–10.3)
Creatinine, Ser: 0.89 mg/dL (ref 0.61–1.24)
GFR calc Af Amer: >60 mL/min (ref >60)
GFR calc non Af Amer: >60 mL/min (ref >60)
GLUCOSE: 103 mg/dL — AB (ref 65–99)
POTASSIUM: 4.2 mmol/L (ref 3.5–5.1)
SODIUM: 136 mmol/L (ref 135–145)
Total Bilirubin: 0.5 mg/dL (ref 0.3–1.2)
Total Protein: 7.3 g/dL (ref 6.5–8.1)

## 2016-12-08 LAB — URINALYSIS, ROUTINE W REFLEX MICROSCOPIC
Bilirubin Urine: NEGATIVE
GLUCOSE, UA: NEGATIVE mg/dL
Hgb urine dipstick: NEGATIVE
KETONES UR: NEGATIVE mg/dL
LEUKOCYTES UA: NEGATIVE
NITRITE: NEGATIVE
PH: 6 (ref 5.0–8.0)
PROTEIN: NEGATIVE mg/dL
Specific Gravity, Urine: <1.005 — ABNORMAL LOW (ref 1.005–1.030)

## 2016-12-08 LAB — OCCULT BLOOD X 1 CARD TO LAB, STOOL: Fecal Occult Bld: NEGATIVE

## 2016-12-08 LAB — CBC
HCT: 37.2 % — ABNORMAL LOW (ref 39.0–52.0)
HEMOGLOBIN: 12.7 g/dL — AB (ref 13.0–17.0)
MCH: 34.2 pg — AB (ref 26.0–34.0)
MCHC: 34.1 g/dL (ref 30.0–36.0)
MCV: 100.3 fL — ABNORMAL HIGH (ref 78.0–100.0)
Platelets: 203 10*3/uL (ref 150–400)
RBC: 3.71 MIL/uL — ABNORMAL LOW (ref 4.22–5.81)
RDW: 13 % (ref 11.5–15.5)
WBC: 4.3 10*3/uL (ref 4.0–10.5)

## 2016-12-08 MED ORDER — MORPHINE SULFATE (PF) 2 MG/ML IV SOLN
2.0000 mg | Freq: Once | INTRAVENOUS | Status: AC
  Administered 2016-12-08: 2 mg via INTRAVENOUS
  Filled 2016-12-08: qty 1

## 2016-12-08 MED ORDER — GI COCKTAIL ~~LOC~~
30.0000 mL | Freq: Once | ORAL | Status: AC
  Administered 2016-12-08: 30 mL via ORAL
  Filled 2016-12-08: qty 30

## 2016-12-08 MED ORDER — ONDANSETRON HCL 4 MG PO TABS: 4.0000 mg | ORAL_TABLET | Freq: Three times a day (TID) | ORAL | 0 refills | Status: DC | PRN

## 2016-12-08 MED ORDER — ONDANSETRON HCL 4 MG/2ML IJ SOLN
4.0000 mg | Freq: Once | INTRAMUSCULAR | Status: AC
  Administered 2016-12-08: 4 mg via INTRAVENOUS
  Filled 2016-12-08: qty 2

## 2016-12-08 MED ORDER — ESOMEPRAZOLE MAGNESIUM 40 MG PO CPDR: 40.0000 mg | DELAYED_RELEASE_CAPSULE | Freq: Every day | ORAL | 0 refills | Status: DC

## 2016-12-08 MED ORDER — SODIUM CHLORIDE 0.9 % IV BOLUS (SEPSIS)
1000.0000 mL | Freq: Once | INTRAVENOUS | Status: AC
  Administered 2016-12-08: 1000 mL via INTRAVENOUS

## 2016-12-08 MED FILL — ONDANSETRON HCL 4 MG TABLET: 4 | 4 days supply | Qty: 12 | Fill #0

## 2016-12-08 MED FILL — ESOMEPRAZOLE MAG DR 40 MG C: 40 | 30 days supply | Qty: 30 | Fill #0

## 2016-12-08 NOTE — ED Provider Notes (Signed)
[facility]-EMERGENCY DEPT Provider Note   CSN: [id number] Arrival date & time: 12/08/16  0940     History   Chief Complaint Chief Complaint  Patient presents with  . Abdominal Pain    HPI Erik Olson is a 56 y.o. male presenting with abdominal cramping, emesis, and blood in his stool.  Patient states 2 weeks ago, he had similar symptoms. He was seen multiple times in the ER and at the [facility] for his abdominal cramping. He was found to have significant stool burden, and told to do a cleanout at home. He had a colonoscopy, where no abnormalities were found. He has had improved symptoms for the past 2 weeks. This morning, he had return of abdominal cramping. It is worse after he eats, but is constant. Cramping is of the upper abdomen, worse on the right upper quadrant. He reports 2 episodes of emesis this morning, the second of which contained minimal blood. He reports 2 bowel movements today, the second of which was darker with blood in the toilet. He has not taken anything for pain today. He does not take medicines daily for heartburn. Prior episodes have been improved with GI cocktail. He has h/o peptic ulcer 20 yrs ago.  He denies history of abdominal problems prior to 2 weeks ago. He denies abdominal surgeries. Patient is waiting on insurance approval for EGD.    HPI  Past Medical History:  Diagnosis Date  . Anxiety   . Bradycardia   . Chest pain   . Depression   . Narrowing of intervertebral disc space    C6-7  . Numbness and tingling in right hand   . PTSD (post-traumatic stress disorder)     Patient Active Problem List   Diagnosis Date Noted  . Narrowing of intervertebral disc space 11/04/2016  . Anemia 11/03/2016  . Chest pain 11/03/2016  . Numbness and tingling in right hand 11/03/2016  . Bradycardia 11/03/2016  . PTSD (post-traumatic stress disorder)   . Normochromic normocytic anemia   . Gastroenteritis 09/03/2010  . ACUTE PHARYNGITIS 05/29/2009  . FATIGUE 05/29/2009   . DIZZINESS 01/30/2009  . CHEST PAIN UNSPECIFIED 01/30/2009  . Alcohol abuse 06/29/2008  . SYNCOPE 06/29/2008  . Anxiety state 01/26/2007  . DEPRESSION 12/31/2006    Past Surgical History:  Procedure Laterality Date  . COLONOSCOPY    . WISDOM TOOTH EXTRACTION         Home Medications    Prior to Admission medications   Medication Sig Start Date End Date Taking? Authorizing Provider  ALPRAZolam Prudy Feeler) 0.5 MG tablet Take 0.5 mg by mouth every 4 (four) hours.     Yes [provider]  gabapentin (NEURONTIN) 300 MG capsule Take 300 mg by mouth 4 (four) times daily.     Yes [provider]  docusate sodium (COLACE) 250 MG capsule Take 1 capsule (250 mg total) by mouth daily. 11/09/16   Law, Waylan Boga, PA-C  esomeprazole (NEXIUM) 40 MG capsule Take 1 capsule (40 mg total) by mouth daily. 12/08/16   Mella Inclan, PA-C  naproxen sodium (ANAPROX) 220 MG tablet Take 220 mg by mouth daily as needed (pain).    [provider]  ondansetron (ZOFRAN) 4 MG tablet Take 1 tablet (4 mg total) by mouth every 6 (six) hours. 11/09/16   Law, Waylan Boga, PA-C  ondansetron (ZOFRAN) 4 MG tablet Take 1 tablet (4 mg total) by mouth every 8 (eight) hours as needed for nausea or vomiting. 12/08/16   Jerritt Cardoza,  PA-C  oxyCODONE-acetaminophen (PERCOCET/ROXICET) 5-325 MG tablet Take 1-2 tablets by mouth every 6 (six) hours as needed for severe pain. 11/09/16   Law, Waylan Boga, PA-C  ranitidine (ZANTAC) 150 MG tablet Take 150 mg by mouth daily.    [provider]    Family History Family History  Problem Relation Age of Onset  . Breast cancer Mother   . Suicidality Brother     Social History Social History  Substance Use Topics  . Smoking status: Never Smoker  . Smokeless tobacco: Current User    Types: Snuff  . Alcohol use No     Allergies   Penicillins   Review of Systems Review of Systems  Constitutional: Negative for chills and fever.  HENT:  Negative for congestion and sore throat.   Respiratory: Negative for cough, chest tightness and shortness of breath.   Cardiovascular: Negative for chest pain and palpitations.  Gastrointestinal: Positive for abdominal pain, blood in stool, nausea and vomiting. Negative for abdominal distention, constipation and diarrhea.  Genitourinary: Negative for dysuria, frequency and hematuria.  Musculoskeletal: Negative for back pain.  Skin: Negative for wound.  Allergic/Immunologic: Negative for immunocompromised state.  Neurological: Negative for dizziness, light-headedness and headaches.  Hematological: Does not bruise/bleed easily.  Psychiatric/Behavioral: Negative for agitation and confusion.     Physical Exam Updated Vital Signs BP 113/90   Pulse (!) 51   Temp 98.4 F (36.9 C) (Oral)   Resp 18   Ht 6' (1.829 m)   Wt 83.9 kg (185 lb)   SpO2 100%   BMI 25.09 kg/m   Physical Exam  Constitutional: He is oriented to person, place, and time. He appears well-developed and well-nourished. No distress.  HENT:  Head: Normocephalic and atraumatic.  Mouth/Throat: Uvula is midline, oropharynx is clear and moist and mucous membranes are normal.  Eyes: Pupils are equal, round, and reactive to light. Conjunctivae and EOM are normal.  Neck: Normal range of motion.  Cardiovascular: Normal rate, regular rhythm and intact distal pulses.   Pulmonary/Chest: Effort normal and breath sounds normal. No respiratory distress. He has no wheezes.  Abdominal: Soft. Normal appearance and bowel sounds are normal. He exhibits no distension. There is tenderness. There is no rigidity, no rebound, no guarding, no CVA tenderness, no tenderness at McBurney's point and negative Murphy's sign.  Generalized TTP of abd, worse in epigastric and RUQ.  No rebound. No rigidity or distention.   Genitourinary: Rectum normal. Rectal exam shows guaiac negative stool.  Musculoskeletal: Normal range of motion.  Lymphadenopathy:     He has no cervical adenopathy.  Neurological: He is alert and oriented to person, place, and time.  Skin: Skin is warm and dry.  Psychiatric: He has a normal mood and affect.  Nursing note and vitals reviewed.    ED Treatments / Results  Labs (all labs ordered are listed, but only abnormal results are displayed) Labs Reviewed  CBC - Abnormal; Notable for the following:       Result Value   RBC 3.71 (*)    Hemoglobin 12.7 (*)    HCT 37.2 (*)    MCV 100.3 (*)    MCH 34.2 (*)    All other components within normal limits  COMPREHENSIVE METABOLIC PANEL - Abnormal; Notable for the following:    Glucose, Bld 103 (*)    All other components within normal limits  URINALYSIS, ROUTINE W REFLEX MICROSCOPIC - Abnormal; Notable for the following:    Specific Gravity, Urine <1.005 (*)  All other components within normal limits  OCCULT BLOOD X 1 CARD TO LAB, STOOL  POC OCCULT BLOOD, ED    EKG  EKG Interpretation None       Radiology No results found.  Procedures Procedures (including critical care time)  Medications Ordered in ED Medications  sodium chloride 0.9 % bolus 1,000 mL (0 mLs Intravenous Stopped 12/08/16 1126)  ondansetron (ZOFRAN) injection 4 mg (4 mg Intravenous Given 12/08/16 1043)  morphine 2 MG/ML injection 2 mg (2 mg Intravenous Given 12/08/16 1043)  gi cocktail (Maalox,Lidocaine,Donnatal) (30 mLs Oral Given 12/08/16 1216)     Initial Impression / Assessment and Plan / ED Course  I have reviewed the triage vital signs and the nursing notes.  Pertinent labs & imaging results that were available during my care of the patient were reviewed by me and considered in my medical decision making (see chart for details).     Pt presenting with abd pain beginning this morning, and 2 episodes of vomiting and diarrhea. Pt has had extensive workup for the same sxs. Pain is worse post-prandially. Pt with 2 CT scans last month, showing heavy stool burden. Will order abd labs,  UA, hemoccult. Start IVF, give zofran and morphine for sx control.   Pt reports pain is improved, but still feeling a little nauseated. Labs reassuring, hemoglobin stable, electrolytes stable, no leukocytosis, no change in liver enzymes or bili. Will try GI cocktail and reassess.   Pt states sxs are improved with GI cocktail. At this time, I do not believe repeat CT scan is necessary. Doubt intraabdominal infection, perforation, or obstruction. Pt appears stable. Possible PUD. Discussed case with attending, and Dr. Rhunette Croft agrees to plan. Pt to f/u with GI. Will start on nexium and give zofran prn. Strict return precautions given. Pt states he understands and agrees to plan.   Final Clinical Impressions(s) / ED Diagnoses   Final diagnoses:  Acute abdominal pain    New Prescriptions Discharge Medication List as of 12/08/2016  1:03 PM    START taking these medications   Details  esomeprazole (NEXIUM) 40 MG capsule Take 1 capsule (40 mg total) by mouth daily., Starting Mon 12/08/2016, Print    !! ondansetron (ZOFRAN) 4 MG tablet Take 1 tablet (4 mg total) by mouth every 8 (eight) hours as needed for nausea or vomiting., Starting Mon 12/08/2016, Print     !! - Potential duplicate medications found. Please discuss with provider.       Alveria Apley, PA-C 12/09/16 1705    Derwood Kaplan, MD 12/09/16 [phone]

## 2016-12-08 NOTE — ED Triage Notes (Addendum)
Pt brought by EMS from his place of work. C/o upper abdominal pain, BRB in BM, nausea and vomited x 2 since midnight. Has been evaluated recently for other GI symptoms

## 2016-12-08 NOTE — Discharge Instructions (Signed)
Take Nexium daily. You may take Zofran as needed for nausea or vomiting. Eat bland foods, small meals at a time. Stay upright for at least 60 minutes after eating. It is very important that you follow-up with your GI doctor. Return to the emergency room if you develop high fevers, chills, persistent vomiting, persistent blood in your stool, or any new or worsening symptoms.

## 2017-01-29 ENCOUNTER — Other Ambulatory Visit: Payer: Non-veteran care

## 2017-03-26 ENCOUNTER — Emergency Department (HOSPITAL_BASED_OUTPATIENT_CLINIC_OR_DEPARTMENT_OTHER)
Admission: EM | Admit: 2017-03-26 | Discharge: 2017-03-26 | Disposition: A | Discharge status: Home / Self Care | Payer: Non-veteran care | Attending: Emergency Medicine | Admitting: Emergency Medicine

## 2017-03-26 ENCOUNTER — Other Ambulatory Visit: Payer: Self-pay

## 2017-03-26 ENCOUNTER — Encounter (HOSPITAL_BASED_OUTPATIENT_CLINIC_OR_DEPARTMENT_OTHER): Payer: Self-pay | Admitting: Emergency Medicine

## 2017-03-26 DIAGNOSIS — Z79899 Other long term (current) drug therapy: Secondary | ICD-10-CM | POA: Insufficient documentation

## 2017-03-26 DIAGNOSIS — B349 Viral infection, unspecified: Secondary | ICD-10-CM | POA: Insufficient documentation

## 2017-03-26 DIAGNOSIS — J029 Acute pharyngitis, unspecified: Secondary | ICD-10-CM | POA: Diagnosis present

## 2017-03-26 DIAGNOSIS — J069 Acute upper respiratory infection, unspecified: Secondary | ICD-10-CM | POA: Insufficient documentation

## 2017-03-26 NOTE — Discharge Instructions (Signed)
Please read and follow all provided instructions.  Your diagnoses today include:  1. Upper respiratory virus     You appear to have an upper respiratory infection (URI). An upper respiratory tract infection, or cold, is a viral infection of the air passages leading to the lungs. It should improve gradually after 5-7 days. You may have a lingering cough that lasts for 2- 4 weeks after the infection.  Tests performed today include:  Vital signs. See below for your results today.   Medications prescribed:   None  Take any prescribed medications only as directed. Treatment for your infection is aimed at treating the symptoms. There are no medications, such as antibiotics, that will cure your infection.   Home care instructions:  Follow any educational materials contained in this packet.   Your illness is contagious and can be spread to others, especially during the first 3 or 4 days. It cannot be cured by antibiotics or other medicines. Take basic precautions such as washing your hands often, covering your mouth when you cough or sneeze, and avoiding public places where you could spread your illness to others.   Please continue drinking plenty of fluids.  Use over-the-counter medicines as needed as directed on packaging for symptom relief.  You may also use ibuprofen or tylenol as directed on packaging for pain or fever.  Do not take multiple medicines containing Tylenol or acetaminophen to avoid taking too much of this medication.  Follow-up instructions: Please follow-up with your primary care provider in the next 3 days for further evaluation of your symptoms if you are not feeling better.   Return instructions:   Please return to the Emergency Department if you experience worsening symptoms.   RETURN IMMEDIATELY IF you develop shortness of breath, confusion or altered mental status, a new rash, become dizzy, faint, or poorly responsive, or are unable to be cared for at home.  Please  return if you have persistent vomiting and cannot keep down fluids or develop a fever that is not controlled by tylenol or motrin.    Please return if you have any other emergent concerns.  Additional Information:  Your vital signs today were: BP 130/83 (BP Location: Right Arm)    Pulse (!) 50    Temp 97.8 F (36.6 C) (Oral)    Resp 18    SpO2 100%  If your blood pressure (BP) was elevated above 135/85 this visit, please have this repeated by your doctor within one month. --------------

## 2017-03-26 NOTE — ED Provider Notes (Signed)
[facility] EMERGENCY DEPARTMENT Provider Note   CSN: [id number] Arrival date & time: 03/26/17  1055     History   Chief Complaint Chief Complaint  Patient presents with  . Sore Throat    HPI Erik Olson is a 57 y.o. male.  Patient presents to the emergency department with complaint of body aches, chills, nasal congestion, runny nose, sore throat, occasional cough over the past 1 day.  Patient was exposed to family members recently, many whom came down with bad upper respiratory infections.  Patient has been using over-the-counter cough and cold medications with some relief.  He states that he is very fatigued and is needed to leave work early to rest.  No fever or ear pain reported.  He has had mild nausea but no vomiting or diarrhea.  No chest pain, abdominal pain, skin rashes, or urinary symptoms.  Onset of symptoms acute.  Course is constant.  Nothing makes symptoms worse.      Past Medical History:  Diagnosis Date  . Anxiety   . Bradycardia   . Chest pain   . Depression   . Narrowing of intervertebral disc space    C6-7  . Numbness and tingling in right hand   . PTSD (post-traumatic stress disorder)     Patient Active Problem List   Diagnosis Date Noted  . Narrowing of intervertebral disc space 11/04/2016  . Anemia 11/03/2016  . Chest pain 11/03/2016  . Numbness and tingling in right hand 11/03/2016  . Bradycardia 11/03/2016  . PTSD (post-traumatic stress disorder)   . Normochromic normocytic anemia   . Gastroenteritis 09/03/2010  . ACUTE PHARYNGITIS 05/29/2009  . FATIGUE 05/29/2009  . DIZZINESS 01/30/2009  . CHEST PAIN UNSPECIFIED 01/30/2009  . Alcohol abuse 06/29/2008  . SYNCOPE 06/29/2008  . Anxiety state 01/26/2007  . DEPRESSION 12/31/2006    Past Surgical History:  Procedure Laterality Date  . COLONOSCOPY    . WISDOM TOOTH EXTRACTION         Home Medications    Prior to Admission medications   Medication Sig Start Date End  Date Taking? Authorizing Provider  esomeprazole (NEXIUM) 40 MG capsule Take 1 capsule (40 mg total) by mouth daily. 12/08/16  Yes Caccavale, Sophia, PA-C  ALPRAZolam (XANAX) 0.5 MG tablet Take 0.5 mg by mouth every 4 (four) hours.      [provider]  gabapentin (NEURONTIN) 300 MG capsule Take 300 mg by mouth 4 (four) times daily.      [provider]  ranitidine (ZANTAC) 150 MG tablet Take 150 mg by mouth daily.    [provider]    Family History Family History  Problem Relation Age of Onset  . Breast cancer Mother   . Suicidality Brother     Social History Social History   Tobacco Use  . Smoking status: Never Smoker  . Smokeless tobacco: Current User    Types: Snuff  Substance Use Topics  . Alcohol use: No  . Drug use: No     Allergies   Penicillins   Review of Systems Review of Systems  Constitutional: Positive for chills and fatigue. Negative for fever.  HENT: Positive for congestion, rhinorrhea and sore throat. Negative for ear pain and sinus pressure.   Eyes: Negative for redness.  Respiratory: Positive for cough. Negative for shortness of breath and wheezing.   Gastrointestinal: Positive for nausea. Negative for abdominal pain, diarrhea and vomiting.  Genitourinary: Negative for dysuria.  Musculoskeletal: Positive for myalgias. Negative  for neck stiffness.  Skin: Negative for rash.  Neurological: Negative for headaches.  Hematological: Negative for adenopathy.     Physical Exam Updated Vital Signs BP 130/83 (BP Location: Right Arm)   Pulse (!) 50   Temp 97.8 F (36.6 C) (Oral)   Resp 18   SpO2 100%   Physical Exam  Constitutional: He appears well-developed and well-nourished.  HENT:  Head: Normocephalic and atraumatic.  Right Ear: Tympanic membrane, external ear and ear canal normal.  Left Ear: Tympanic membrane, external ear and ear canal normal.  Nose: Nose normal. No mucosal edema or rhinorrhea.  Mouth/Throat: Uvula  is midline and mucous membranes are normal. Mucous membranes are not dry. No trismus in the jaw. No uvula swelling. Posterior oropharyngeal erythema present. No oropharyngeal exudate, posterior oropharyngeal edema or tonsillar abscesses.  Eyes: Conjunctivae are normal. Right eye exhibits no discharge. Left eye exhibits no discharge.  Neck: Normal range of motion. Neck supple.  Cardiovascular: Regular rhythm and normal heart sounds.  Bradycardia  Pulmonary/Chest: Effort normal and breath sounds normal. No respiratory distress. He has no wheezes. He has no rales.  Abdominal: Soft. There is no tenderness.  Neurological: He is alert.  Skin: Skin is warm and dry.  Psychiatric: He has a normal mood and affect.  Nursing note and vitals reviewed.    ED Treatments / Results   Procedures Procedures (including critical care time)  Medications Ordered in ED Medications - No data to display   Initial Impression / Assessment and Plan / ED Course  I have reviewed the triage vital signs and the nursing notes.  Pertinent labs & imaging results that were available during my care of the patient were reviewed by me and considered in my medical decision making (see chart for details).     Patient seen and examined.   Vital signs reviewed and are as follows: BP 130/83 (BP Location: Right Arm)   Pulse (!) 50   Temp 97.8 F (36.6 C) (Oral)   Resp 18   SpO2 100%   Patient counseled on supportive care for viral URI and s/s to return including worsening symptoms, persistent fever, persistent vomiting, or if they have any other concerns. Urged to see PCP if symptoms persist for more than 3 days. Patient verbalizes understanding and agrees with plan.    Final Clinical Impressions(s) / ED Diagnoses   Final diagnoses:  Upper respiratory virus   Patient with symptoms consistent with a viral syndrome. Vitals are stable, no fever. No signs of dehydration. Lung exam normal, no signs of pneumonia.  Supportive therapy indicated with return if symptoms worsen.     ED Discharge Orders    None       Renne CriglerGeiple, Caleesi Kohl, Cordelia Poche-C 03/26/17 1155    Cathren LaineSteinl, Kevin, MD 03/26/17 1526

## 2017-03-26 NOTE — ED Triage Notes (Signed)
Pt having nasal congestion, stuffy nose, sore throat, chills and body aches since yesterday.   Some nausea, no diarrhea or vomiting.  Family was sick last week.

## 2017-05-12 ENCOUNTER — Emergency Department (HOSPITAL_BASED_OUTPATIENT_CLINIC_OR_DEPARTMENT_OTHER)
Admission: EM | Admit: 2017-05-12 | Discharge: 2017-05-12 | Disposition: A | Discharge status: Home / Self Care | Payer: Non-veteran care | Attending: Emergency Medicine | Admitting: Emergency Medicine

## 2017-05-12 ENCOUNTER — Emergency Department (HOSPITAL_BASED_OUTPATIENT_CLINIC_OR_DEPARTMENT_OTHER): Payer: Non-veteran care

## 2017-05-12 ENCOUNTER — Encounter (HOSPITAL_BASED_OUTPATIENT_CLINIC_OR_DEPARTMENT_OTHER): Payer: Self-pay | Admitting: *Deleted

## 2017-05-12 ENCOUNTER — Other Ambulatory Visit: Payer: Self-pay

## 2017-05-12 DIAGNOSIS — R112 Nausea with vomiting, unspecified: Secondary | ICD-10-CM | POA: Insufficient documentation

## 2017-05-12 DIAGNOSIS — R05 Cough: Secondary | ICD-10-CM | POA: Insufficient documentation

## 2017-05-12 DIAGNOSIS — R69 Illness, unspecified: Secondary | ICD-10-CM

## 2017-05-12 DIAGNOSIS — J111 Influenza due to unidentified influenza virus with other respiratory manifestations: Secondary | ICD-10-CM | POA: Diagnosis not present

## 2017-05-12 DIAGNOSIS — F17228 Nicotine dependence, chewing tobacco, with other nicotine-induced disorders: Secondary | ICD-10-CM | POA: Diagnosis not present

## 2017-05-12 DIAGNOSIS — R058 Other specified cough: Secondary | ICD-10-CM

## 2017-05-12 DIAGNOSIS — Z79899 Other long term (current) drug therapy: Secondary | ICD-10-CM | POA: Insufficient documentation

## 2017-05-12 DIAGNOSIS — R197 Diarrhea, unspecified: Secondary | ICD-10-CM | POA: Diagnosis present

## 2017-05-12 NOTE — ED Notes (Signed)
ED Provider at bedside. 

## 2017-05-12 NOTE — ED Notes (Signed)
Patient transported to X-ray 

## 2017-05-12 NOTE — ED Notes (Signed)
Pt given 2 small cans of sprite per request.

## 2017-05-12 NOTE — ED Provider Notes (Signed)
[facility] EMERGENCY DEPARTMENT Provider Note   CSN: [id number] Arrival date & time: 05/12/17  0735     History   Chief Complaint Chief Complaint  Patient presents with  . Diarrhea    HPI Erik Olson is a 57 y.o. male.  Patient c/o productive cough, occ brownish sputum, also w recent nvd illness. States grandchild over weekend had gi bug, and that pt had episodes of nv, and a few diarrhea stools. Emesis not bloody or bilious. No diarrhea or vomiting today. No abd pain. Cough episodic, persistent. States had pna last month. Denies sore throat or runny nose. Low grade fever. No sob. No chest pain.    The history is provided by the patient.  Diarrhea   Associated symptoms include vomiting and cough. Pertinent negatives include no abdominal pain, no chills and no headaches.    Past Medical History:  Diagnosis Date  . Anxiety   . Bradycardia   . Chest pain   . Depression   . Narrowing of intervertebral disc space    C6-7  . Numbness and tingling in right hand   . PTSD (post-traumatic stress disorder)     Patient Active Problem List   Diagnosis Date Noted  . Narrowing of intervertebral disc space 11/04/2016  . Anemia 11/03/2016  . Chest pain 11/03/2016  . Numbness and tingling in right hand 11/03/2016  . Bradycardia 11/03/2016  . PTSD (post-traumatic stress disorder)   . Normochromic normocytic anemia   . Gastroenteritis 09/03/2010  . ACUTE PHARYNGITIS 05/29/2009  . FATIGUE 05/29/2009  . DIZZINESS 01/30/2009  . CHEST PAIN UNSPECIFIED 01/30/2009  . Alcohol abuse 06/29/2008  . SYNCOPE 06/29/2008  . Anxiety state 01/26/2007  . DEPRESSION 12/31/2006    Past Surgical History:  Procedure Laterality Date  . COLONOSCOPY    . WISDOM TOOTH EXTRACTION         Home Medications    Prior to Admission medications   Medication Sig Start Date End Date Taking? Authorizing Provider  ALPRAZolam Prudy Feeler) 0.5 MG tablet Take 0.5 mg by mouth every 4 (four)  hours.      [provider]  esomeprazole (NEXIUM) 40 MG capsule Take 1 capsule (40 mg total) by mouth daily. 12/08/16   Caccavale, Sophia, PA-C  gabapentin (NEURONTIN) 300 MG capsule Take 300 mg by mouth 4 (four) times daily.      [provider]  ranitidine (ZANTAC) 150 MG tablet Take 150 mg by mouth daily.    [provider]    Family History Family History  Problem Relation Age of Onset  . Breast cancer Mother   . Suicidality Brother     Social History Social History   Tobacco Use  . Smoking status: Never Smoker  . Smokeless tobacco: Current User    Types: Snuff  Substance Use Topics  . Alcohol use: No  . Drug use: No     Allergies   Penicillins   Review of Systems Review of Systems  Constitutional: Negative for chills.  HENT: Negative for sore throat.   Eyes: Negative for redness.  Respiratory: Positive for cough. Negative for shortness of breath.   Cardiovascular: Negative for chest pain.  Gastrointestinal: Positive for diarrhea and vomiting. Negative for abdominal pain.  Genitourinary: Negative for dysuria.  Musculoskeletal: Negative for neck pain and neck stiffness.  Skin: Negative for rash.  Neurological: Negative for headaches.  Hematological: Does not bruise/bleed easily.  Psychiatric/Behavioral: Negative for confusion.     Physical Exam Updated Vital Signs  BP 122/73 (BP Location: Right Arm)   Pulse 63   Temp 98.1 F (36.7 C) (Oral)   Resp 18   Ht 1.829 m (6')   Wt 83 kg (183 lb)   SpO2 100%   BMI 24.82 kg/m   Physical Exam  Constitutional: He appears well-developed and well-nourished. No distress.  HENT:  Mouth/Throat: Oropharynx is clear and moist.  Eyes: Conjunctivae are normal.  Neck: Neck supple. No tracheal deviation present.  Cardiovascular: Normal rate, regular rhythm, normal heart sounds and intact distal pulses. Exam reveals no gallop and no friction rub.  No murmur heard. Pulmonary/Chest: Effort normal  and breath sounds normal. No accessory muscle usage. No respiratory distress.  Abdominal: Soft. Bowel sounds are normal. He exhibits no distension. There is no tenderness.  Musculoskeletal: He exhibits no edema or tenderness.  Neurological: He is alert.  Skin: Skin is warm and dry. No rash noted. He is not diaphoretic.  Psychiatric: He has a normal mood and affect.  Nursing note and vitals reviewed.    ED Treatments / Results  Labs (all labs ordered are listed, but only abnormal results are displayed) Labs Reviewed - No data to display  EKG  EKG Interpretation None       Radiology Dg Chest 2 View  Result Date: 05/12/2017 CLINICAL DATA:  Cough, fever EXAM: CHEST  2 VIEW COMPARISON:  11/09/2016 FINDINGS: Heart and mediastinal contours are within normal limits. No focal opacities or effusions. No acute bony abnormality. IMPRESSION: No active cardiopulmonary disease. Electronically Signed   By: Charlett NoseKevin  Dover M.D.   On: 05/12/2017 08:27    Procedures Procedures (including critical care time)  Medications Ordered in ED Medications - No data to display   Initial Impression / Assessment and Plan / ED Course  I have reviewed the triage vital signs and the nursing notes.  Pertinent labs & imaging results that were available during my care of the patient were reviewed by me and considered in my medical decision making (see chart for details).  Xray.  Trial of po fluids.  Reviewed nursing notes and prior charts for additional history.   xrays reviewed - no pna.   Tolerating po fluids.   Suspect probably viral/flu-like illness.   No cp, no increased wob.   Patient appears stable for d/c.  Pt requests work note.   Final Clinical Impressions(s) / ED Diagnoses   Final diagnoses:  None    ED Discharge Orders    None       Cathren LaineSteinl, Shermaine Rivet, MD 05/12/17 [phone]

## 2017-05-12 NOTE — ED Triage Notes (Signed)
Pt reports his grandchildren were visiting this weekend and his grand daughter had a gi bug on Friday. He and his wife both started with vomiting and diarrhea on Sunday. Pt has had neither today, last emesis and diarrhea yesterday. Pt also reports being dx with pnuemonia 2 weeks ago, finished po antibiotics last Tuesday, cont with cough producing brown sputum.

## 2017-05-12 NOTE — ED Notes (Signed)
Pt has consumed 2 small cans of sprite, denies any nausea or pain at this time.

## 2017-05-12 NOTE — Discharge Instructions (Signed)
It was our pleasure to provide your ER care today - we hope that you feel better.  Rest. Drink plenty of  fluids.  If cough/congestion - you may try taking over the counter cold/flu medication as need for symptom relief.   Follow up with primary care doctor in 1 week if symptoms fail to improve/resolve.  Return to ER if worse, new symptoms, increased trouble breathing, persistent vomiting, abdominal pain, other concern.

## 2017-07-05 ENCOUNTER — Encounter (HOSPITAL_BASED_OUTPATIENT_CLINIC_OR_DEPARTMENT_OTHER): Payer: Self-pay

## 2017-07-05 ENCOUNTER — Observation Stay (HOSPITAL_BASED_OUTPATIENT_CLINIC_OR_DEPARTMENT_OTHER)
Admission: EM | Admit: 2017-07-05 | Discharge: 2017-07-06 | Disposition: A | Discharge status: Home / Self Care | Payer: Non-veteran care | Attending: Internal Medicine | Admitting: Internal Medicine

## 2017-07-05 ENCOUNTER — Observation Stay (HOSPITAL_BASED_OUTPATIENT_CLINIC_OR_DEPARTMENT_OTHER): Payer: Non-veteran care

## 2017-07-05 ENCOUNTER — Other Ambulatory Visit: Payer: Self-pay

## 2017-07-05 DIAGNOSIS — D539 Nutritional anemia, unspecified: Secondary | ICD-10-CM | POA: Insufficient documentation

## 2017-07-05 DIAGNOSIS — R55 Syncope and collapse: Principal | ICD-10-CM | POA: Diagnosis present

## 2017-07-05 DIAGNOSIS — R531 Weakness: Secondary | ICD-10-CM | POA: Insufficient documentation

## 2017-07-05 DIAGNOSIS — Z880 Allergy status to penicillin: Secondary | ICD-10-CM | POA: Insufficient documentation

## 2017-07-05 DIAGNOSIS — Z79899 Other long term (current) drug therapy: Secondary | ICD-10-CM | POA: Insufficient documentation

## 2017-07-05 DIAGNOSIS — R001 Bradycardia, unspecified: Secondary | ICD-10-CM | POA: Insufficient documentation

## 2017-07-05 DIAGNOSIS — I951 Orthostatic hypotension: Secondary | ICD-10-CM | POA: Insufficient documentation

## 2017-07-05 DIAGNOSIS — F329 Major depressive disorder, single episode, unspecified: Secondary | ICD-10-CM | POA: Insufficient documentation

## 2017-07-05 DIAGNOSIS — F4310 Post-traumatic stress disorder, unspecified: Secondary | ICD-10-CM | POA: Insufficient documentation

## 2017-07-05 HISTORY — DX: Anemia, unspecified: D64.9

## 2017-07-05 LAB — URINALYSIS, ROUTINE W REFLEX MICROSCOPIC
Bilirubin Urine: NEGATIVE
GLUCOSE, UA: NEGATIVE mg/dL
HGB URINE DIPSTICK: NEGATIVE
KETONES UR: NEGATIVE mg/dL
Leukocytes, UA: NEGATIVE
Nitrite: NEGATIVE
Protein, ur: NEGATIVE mg/dL
Specific Gravity, Urine: <1.005 — ABNORMAL LOW (ref 1.005–1.030)
pH: 6 (ref 5.0–8.0)

## 2017-07-05 LAB — BASIC METABOLIC PANEL
Anion gap: 5 (ref 5–15)
BUN: 14 mg/dL (ref 6–20)
CALCIUM: 8.5 mg/dL — AB (ref 8.9–10.3)
CHLORIDE: 105 mmol/L (ref 101–111)
CO2: 25 mmol/L (ref 22–32)
CREATININE: 0.87 mg/dL (ref 0.61–1.24)
GFR calc non Af Amer: >60 mL/min (ref >60)
Glucose, Bld: 91 mg/dL (ref 65–99)
Potassium: 4 mmol/L (ref 3.5–5.1)
SODIUM: 135 mmol/L (ref 135–145)

## 2017-07-05 LAB — RAPID URINE DRUG SCREEN, HOSP PERFORMED
Amphetamines: NOT DETECTED
BARBITURATES: NOT DETECTED
BENZODIAZEPINES: POSITIVE — AB
Cocaine: NOT DETECTED
Opiates: NOT DETECTED
TETRAHYDROCANNABINOL: NOT DETECTED

## 2017-07-05 LAB — CBC
HCT: 37.1 % — ABNORMAL LOW (ref 39.0–52.0)
Hemoglobin: 12.6 g/dL — ABNORMAL LOW (ref 13.0–17.0)
MCH: 34.9 pg — AB (ref 26.0–34.0)
MCHC: 34 g/dL (ref 30.0–36.0)
MCV: 102.8 fL — AB (ref 78.0–100.0)
PLATELETS: 193 10*3/uL (ref 150–400)
RBC: 3.61 MIL/uL — AB (ref 4.22–5.81)
RDW: 12.8 % (ref 11.5–15.5)
WBC: 7.5 10*3/uL (ref 4.0–10.5)

## 2017-07-05 LAB — OCCULT BLOOD X 1 CARD TO LAB, STOOL: FECAL OCCULT BLD: NEGATIVE

## 2017-07-05 LAB — TSH: TSH: 0.772 u[IU]/mL (ref 0.350–4.500)

## 2017-07-05 MED ORDER — ALPRAZOLAM 0.5 MG PO TABS
0.5000 mg | ORAL_TABLET | Freq: Three times a day (TID) | ORAL | Status: DC | PRN
  Administered 2017-07-05 – 2017-07-06 (×2): 0.5 mg via ORAL
  Filled 2017-07-05 (×2): qty 1

## 2017-07-05 MED ORDER — GABAPENTIN 400 MG PO CAPS
400.0000 mg | ORAL_CAPSULE | Freq: Four times a day (QID) | ORAL | Status: DC
  Administered 2017-07-05 – 2017-07-06 (×2): 400 mg via ORAL
  Filled 2017-07-05 (×2): qty 1

## 2017-07-05 NOTE — ED Provider Notes (Signed)
[facility] EMERGENCY DEPARTMENT Provider Note   CSN: [id number] Arrival date & time: 07/05/17  1222     History   Chief Complaint Chief Complaint  Patient presents with  . Near Syncope    HPI Erik Olson is a 57 y.o. male.  HPI Patient reports he had shaking chills yesterday and today.  This morning he became lightheaded and suffered syncopal event lasting 15 seconds witnessed by his wife.  He fell off the couch from a seated position.  He suffered no injury.  He complains of generalized weakness presently.  No fever.  No cough.  No urinary symptoms.  He was brought by EMS.  EMS treated patient with saline 500 mL intravenous bolus prior to arrival.  No other associated symptoms.  Denies chest pain denies shortness of breath.  He does complain of crampy abdominal pain since September 2019 which is constant and unchanged.  Patient had CT [facility] showing tiny pulmonary nodule.  No acute abnormality and no need for follow-up denies blood in stools or black stools. Past Medical History:  Diagnosis Date  . Anemia   . Anxiety   . Bradycardia   . Chest pain   . Depression   . Narrowing of intervertebral disc space    C6-7  . Numbness and tingling in right hand   . PTSD (post-traumatic stress disorder)     Patient Active Problem List   Diagnosis Date Noted  . Narrowing of intervertebral disc space 11/04/2016  . Anemia 11/03/2016  . Chest pain 11/03/2016  . Numbness and tingling in right hand 11/03/2016  . Bradycardia 11/03/2016  . PTSD (post-traumatic stress disorder)   . Normochromic normocytic anemia   . Gastroenteritis 09/03/2010  . ACUTE PHARYNGITIS 05/29/2009  . FATIGUE 05/29/2009  . DIZZINESS 01/30/2009  . CHEST PAIN UNSPECIFIED 01/30/2009  . Alcohol abuse 06/29/2008  . SYNCOPE 06/29/2008  . Anxiety state 01/26/2007  . DEPRESSION 12/31/2006    Past Surgical History:  Procedure Laterality Date  .  COLONOSCOPY    . WISDOM TOOTH EXTRACTION          Home Medications    Prior to Admission medications   Medication Sig Start Date End Date Taking? Authorizing Provider  ALPRAZolam Prudy Feeler(XANAX) 0.5 MG tablet Take 0.5 mg by mouth every 4 (four) hours.      [provider]  esomeprazole (NEXIUM) 40 MG capsule Take 1 capsule (40 mg total) by mouth daily. 12/08/16   Caccavale, Sophia, PA-C  gabapentin (NEURONTIN) 300 MG capsule Take 300 mg by mouth 4 (four) times daily.      [provider]  ranitidine (ZANTAC) 150 MG tablet Take 150 mg by mouth daily.    [provider]    Family History Family History  Problem Relation Age of Onset  . Breast cancer Mother   . Suicidality Brother     Social History Social History   Tobacco Use  . Smoking status: Never Smoker  . Smokeless tobacco: Current User    Types: Snuff  Substance Use Topics  . Alcohol use: No  . Drug use: No     Allergies   Penicillins   Review of Systems Review of Systems  Constitutional: Positive for chills.  Gastrointestinal: Positive for abdominal pain and blood in stool.  Neurological: Positive for weakness.  All other systems reviewed and are negative.    Physical Exam Updated Vital Signs BP (!) 110/58  Pulse (!) 49   Temp 99.5 F (37.5 C) (Oral)   Resp 16   Ht 6' (1.829 m)   Wt 83 kg (183 lb)   SpO2 99%   BMI 24.82 kg/m   Physical Exam  Constitutional: He is oriented to person, place, and time. He appears well-developed and well-nourished.  HENT:  Head: Normocephalic and atraumatic.  Eyes: Pupils are equal, round, and reactive to light. Conjunctivae are normal.  Neck: Neck supple. No tracheal deviation present. No thyromegaly present.  Cardiovascular: Normal rate and regular rhythm.  No murmur heard. Pulmonary/Chest: Effort normal and breath sounds normal.  Abdominal: Soft. Bowel sounds are normal. He exhibits no distension. There is tenderness.  Mild periumbilical  tenderness  Genitourinary: Rectum normal. Rectal exam shows guaiac negative stool.  Genitourinary Comments: Normal tone soft brown stool no gross blood  Musculoskeletal: Normal range of motion. He exhibits no edema or tenderness.  Neurological: He is alert and oriented to person, place, and time. Coordination normal.  Skin: Skin is warm and dry. Capillary refill takes 2 to 3 seconds. No rash noted.  Psychiatric: He has a normal mood and affect.  Nursing note and vitals reviewed.    ED Treatments / Results  Labs (all labs ordered are listed, but only abnormal results are displayed) Labs Reviewed  BASIC METABOLIC PANEL - Abnormal; Notable for the following components:      Result Value   Calcium 8.5 (*)    All other components within normal limits  CBC - Abnormal; Notable for the following components:   RBC 3.61 (*)    Hemoglobin 12.6 (*)    HCT 37.1 (*)    MCV 102.8 (*)    MCH 34.9 (*)    All other components within normal limits    EKG EKG Interpretation  Date/Time:  Sunday July 05 2017 12:34:25 EDT Ventricular Rate:  55 PR Interval:    QRS Duration: 108 QT Interval:  419 QTC Calculation: 401 R Axis:   19 Text Interpretation:  Sinus rhythm RSR' in V1 or V2, probably normal variant n\ Confirmed by Doug Sou [phone]) on 07/05/2017 12:37:03 PM  chest x-ray reviewed by me Radiology No results found. Results for orders placed or performed during the hospital encounter of 07/05/17  Basic metabolic panel  Result Value Ref Range   Sodium 135 135 - 145 mmol/L   Potassium 4.0 3.5 - 5.1 mmol/L   Chloride 105 101 - 111 mmol/L   CO2 25 22 - 32 mmol/L   Glucose, Bld 91 65 - 99 mg/dL   BUN 14 6 - 20 mg/dL   Creatinine, Ser 5.78 0.61 - 1.24 mg/dL   Calcium 8.5 (L) 8.9 - 10.3 mg/dL   GFR calc non Af Amer >60 >60 mL/min   GFR calc Af Amer >60 >60 mL/min   Anion gap 5 5 - 15  CBC  Result Value Ref Range   WBC 7.5 4.0 - 10.5 K/uL   RBC 3.61 (L) 4.22 - 5.81 MIL/uL    Hemoglobin 12.6 (L) 13.0 - 17.0 g/dL   HCT 46.9 (L) 62.9 - 52.8 %   MCV 102.8 (H) 78.0 - 100.0 fL   MCH 34.9 (H) 26.0 - 34.0 pg   MCHC 34.0 30.0 - 36.0 g/dL   RDW 41.3 24.4 - 01.0 %   Platelets 193 150 - 400 K/uL  Occult blood card to lab, stool Provider will collect  Result Value Ref Range   Fecal Occult Bld NEGATIVE NEGATIVE  Rapid  urine drug screen (hospital performed)  Result Value Ref Range   Opiates NONE DETECTED NONE DETECTED   Cocaine NONE DETECTED NONE DETECTED   Benzodiazepines POSITIVE (A) NONE DETECTED   Amphetamines NONE DETECTED NONE DETECTED   Tetrahydrocannabinol NONE DETECTED NONE DETECTED   Barbiturates NONE DETECTED NONE DETECTED  Urinalysis, Routine w reflex microscopic  Result Value Ref Range   Color, Urine YELLOW YELLOW   APPearance CLEAR CLEAR   Specific Gravity, Urine <1.005 (L) 1.005 - 1.030   pH 6.0 5.0 - 8.0   Glucose, UA NEGATIVE NEGATIVE mg/dL   Hgb urine dipstick NEGATIVE NEGATIVE   Bilirubin Urine NEGATIVE NEGATIVE   Ketones, ur NEGATIVE NEGATIVE mg/dL   Protein, ur NEGATIVE NEGATIVE mg/dL   Nitrite NEGATIVE NEGATIVE   Leukocytes, UA NEGATIVE NEGATIVE   Dg Chest 2 View  Result Date: 07/05/2017 CLINICAL DATA:  Pt with anemia x 7 months; some SOB; low bp; passed out at home today; bradycardia EXAM: CHEST - 2 VIEW COMPARISON:  Chest x-ray dated 05/12/2017. FINDINGS: The heart size and mediastinal contours are within normal limits. Both lungs are clear. The visualized skeletal structures are unremarkable. IMPRESSION: No active cardiopulmonary disease. Electronically Signed   By: Bary Richard M.D.   On: 07/05/2017 14:38   Procedures Procedures (including critical care time)  Medications Ordered in ED Medications - No data to display   Initial Impression / Assessment and Plan / ED Course  I have reviewed the triage vital signs and the nursing notes.  Pertinent labs & imaging results that were available during my care of the patient were reviewed  by me and considered in my medical decision making (see chart for details).     Dr. Lowell Guitar from hospitalist service consulted and accepts patient in transfer to [facility] long hospital Lab work remarkable for anemia which is chronic and unchanged from 6 months ago Final Clinical Impressions(s) / ED Diagnoses  Diagnoses #1 syncope #2 anemia #3 chronic abdominal pain Final diagnoses:  None    ED Discharge Orders    None       Doug Sou, MD 07/05/17 1529

## 2017-07-05 NOTE — ED Triage Notes (Signed)
Pt dx with Anemia in Sept via [facility] - took iron supplement yesterday and has not felt well since then - reports near syncope this morning, orthostatic hypotension from 100/60 down to 80/50 upon standing. 18LAC with 500ml NS and Zofran 4mg  given via EMS - CBG 99.

## 2017-07-05 NOTE — H&P (Signed)
History and Physical    Erik Olson ZOX:[id number] DOB: [birth year] DOA: 07/05/2017  PCP: [facility]  Patient coming from: home  I have personally briefly reviewed patient's old medical records in [facility]  Chief Complaint: syncope  HPI: Erik Olson is a 57 y.o. male with medical history significant of PTSD and anxiety presenting with an episode of syncope.   He thinks that his symptoms started after he had a bad reaction to an iron supplement.  He notes that after he took the iron supplement he felt like he was freezing all night long and had an abnormal sensation.  He notes that he woke up this morning exhausted.  He sat up on the edge of the bed leaning forward to tie his shoes and then he passed out.  The episode lasted about 15 seconds.  He was orthostatic for EMS and got a 500 cc bolus.  In addition to the episode of syncope, he notes several chronic issues.  He notes that his hands feel cold all the time and he feels that his immune system has been poor recently and that he has had many recent infections.  He notes that he has been dealing with anemia since last September.  He notes that he has been exhausted since that time.  He denies any chest pain, fevers.  He does note some shortness of breath that occurred last night when he felt like there is some tightness in his throat, but that resolved.  He notes that he has fainted before in the past, but the last occurrence was years ago.  He has had several visits to the ED over the past year.  ED Course: Labs, EKG.  Transfer to [facility] for syncope w/u.  Review of Systems: As per HPI otherwise 10 point review of systems negative.   Past Medical History:  Diagnosis Date  . Anemia   . Anxiety   . Bradycardia   . Chest pain   . Depression   . Narrowing of intervertebral disc space    C6-7  . Numbness and tingling in right hand   . PTSD (post-traumatic stress disorder)     Past Surgical History:  Procedure  Laterality Date  . COLONOSCOPY    . WISDOM TOOTH EXTRACTION       reports that he has never smoked. His smokeless tobacco use includes snuff. He reports that he does not drink alcohol or use drugs.  Allergies  Allergen Reactions  . Penicillins Anaphylaxis    Has patient had a PCN reaction causing immediate rash, facial/tongue/throat swelling, SOB or lightheadedness with hypotension: Yes Has patient had a PCN reaction causing severe rash involving mucus membranes or skin necrosis: Yes Has patient had a PCN reaction that required hospitalization: Yes Has patient had a PCN reaction occurring within the last 10 years: no/ If all of the above answers are "NO", then may proceed with Cephalosporin use.     Family History  Problem Relation Age of Onset  . Breast cancer Mother   . Suicidality Brother    Prior to Admission medications   Medication Sig Start Date End Date Taking? Authorizing Provider  ALPRAZolam Prudy Feeler) 0.5 MG tablet Take 0.5 mg by mouth every 4 (four) hours.     Yes [provider]  gabapentin (NEURONTIN) 400 MG capsule Take 400 mg by mouth 4 (four) times daily.   Yes [provider]  esomeprazole (NEXIUM) 40 MG capsule Take 1 capsule (40 mg total) by  mouth daily. 12/08/16   Caccavale, Sophia, PA-C  gabapentin (NEURONTIN) 300 MG capsule Take 300 mg by mouth 4 (four) times daily.      [provider]  ranitidine (ZANTAC) 150 MG tablet Take 150 mg by mouth daily.    [provider]    Physical Exam: Vitals:   07/05/17 1430 07/05/17 1445 07/05/17 1500 07/05/17 1555  BP: 117/76  120/65 123/70  Pulse: (!) 56 60 63 (!) 42  Resp: 10 12 13 20   Temp:    98.6 F (37 C)  TempSrc:    Oral  SpO2: 100% 99% 100% 95%  Weight:    82.1 kg (181 lb)  Height:    6' (1.829 m)    Constitutional: NAD, calm, comfortable Vitals:   07/05/17 1430 07/05/17 1445 07/05/17 1500 07/05/17 1555  BP: 117/76  120/65 123/70  Pulse: (!) 56 60 63 (!) 42  Resp: 10  12 13 20   Temp:    98.6 F (37 C)  TempSrc:    Oral  SpO2: 100% 99% 100% 95%  Weight:    82.1 kg (181 lb)  Height:    6' (1.829 m)   Eyes: PERRL, lids and conjunctivae normal ENMT: Mucous membranes are moist. Posterior pharynx clear of any exudate or lesions.Normal dentition.  Neck: normal, supple, no masses, no thyromegaly Respiratory: clear to auscultation bilaterally, no wheezing, no crackles. Normal respiratory effort. No accessory muscle use.  Cardiovascular: Regular rate and rhythm, no murmurs / rubs / gallops. No extremity edema. 2+ pedal pulses. No carotid bruits.  Abdomen: no tenderness, no masses palpated. No hepatosplenomegaly. Bowel sounds positive.  Musculoskeletal: no clubbing / cyanosis. No joint deformity upper and lower extremities. Good ROM, no contractures. Normal muscle tone.  Skin: no rashes, lesions, ulcers. No induration Neurologic: CN 2-12 grossly intact. Sensation intact Strength 5/5 in all 4.  Psychiatric: Normal judgment and insight. Alert and oriented x 3. Normal mood.   Labs on Admission: I have personally reviewed following labs and imaging studies  CBC: Recent Labs  Lab 07/05/17 1239  WBC 7.5  HGB 12.6*  HCT 37.1*  MCV 102.8*  PLT 193   Basic Metabolic Panel: Recent Labs  Lab 07/05/17 1239  NA 135  K 4.0  CL 105  CO2 25  GLUCOSE 91  BUN 14  CREATININE 0.87  CALCIUM 8.5*   GFR: Estimated Creatinine Clearance: 106.5 mL/min (by C-G formula based on SCr of 0.87 mg/dL). Liver Function Tests: No results for input(s): AST, ALT, ALKPHOS, BILITOT, PROT, ALBUMIN in the last 168 hours. No results for input(s): LIPASE, AMYLASE in the last 168 hours. No results for input(s): AMMONIA in the last 168 hours. Coagulation Profile: No results for input(s): INR, PROTIME in the last 168 hours. Cardiac Enzymes: No results for input(s): CKTOTAL, CKMB, CKMBINDEX, TROPONINI in the last 168 hours. BNP (last 3 results) No results for input(s): PROBNP in the  last 8760 hours. HbA1C: No results for input(s): HGBA1C in the last 72 hours. CBG: No results for input(s): GLUCAP in the last 168 hours. Lipid Profile: No results for input(s): CHOL, HDL, LDLCALC, TRIG, CHOLHDL, LDLDIRECT in the last 72 hours. Thyroid Function Tests: No results for input(s): TSH, T4TOTAL, FREET4, T3FREE, THYROIDAB in the last 72 hours. Anemia Panel: No results for input(s): VITAMINB12, FOLATE, FERRITIN, TIBC, IRON, RETICCTPCT in the last 72 hours. Urine analysis:    Component Value Date/Time   COLORURINE YELLOW 07/05/2017 1239   APPEARANCEUR CLEAR 07/05/2017 1239   LABSPEC <1.005 (  L) 07/05/2017 1239   PHURINE 6.0 07/05/2017 1239   GLUCOSEU NEGATIVE 07/05/2017 1239   HGBUR NEGATIVE 07/05/2017 1239   BILIRUBINUR NEGATIVE 07/05/2017 1239   BILIRUBINUR neg 09/03/2010 1348   KETONESUR NEGATIVE 07/05/2017 1239   PROTEINUR NEGATIVE 07/05/2017 1239   UROBILINOGEN 0.2 10/22/2012 0914   NITRITE NEGATIVE 07/05/2017 1239   LEUKOCYTESUR NEGATIVE 07/05/2017 1239    Radiological Exams on Admission: Dg Chest 2 View  Result Date: 07/05/2017 CLINICAL DATA:  Pt with anemia x 7 months; some SOB; low bp; passed out at home today; bradycardia EXAM: CHEST - 2 VIEW COMPARISON:  Chest x-ray dated 05/12/2017. FINDINGS: The heart size and mediastinal contours are within normal limits. Both lungs are clear. The visualized skeletal structures are unremarkable. IMPRESSION: No active cardiopulmonary disease. Electronically Signed   By: Bary Richard M.D.   On: 07/05/2017 14:38    EKG: Independently reviewed. Sinus bradycardia.  Similar to priors.   Assessment/Plan Active Problems:   Syncope  Syncope:  Suspect orthostatic vs reflex given it occurred when he leaned over.  Follow on telemetry.  EKG notable for sinus bradycardia.  Follow TSH.  Follow orthostatics.  PTSD  Anxiety: continue xanax  Anemia: this is mild, he notes he's following with his PCP for this.  Fatigue  Malaise:  unclear etiology for this, but seems it's been bothering him for past few months.  Continue to follow up as outpatient.  Cold Hands:  For months.  Good cap refill.  Good pulses.  ?raynauds.  With syncope and bradycardia, and orthostatic hypotension with EMS will hold off on trial of ccb.  Follow outpatient.   DVT prophylaxis: scd, anticipate short stay  Code Status: full  Family Communication: wife at bedside  Disposition Plan: pending observation Consults called: none  Admission status: obs    Lacretia Nicks MD [facility] Pager [phone]  If 7PM-7AM, please contact night-coverage [email or web address] Password [facility]  07/05/2017, 6:46 PM

## 2017-07-05 NOTE — Treatment Plan (Signed)
57 yo with hx of PTSD presenting to ED after syncope while seated.  [facility]  EKG notable for sinus bradycardia.  Labs notable for mild anemia.  Reportedly orthostatic with EMS, s/p IVF.  Repeat orthostatics in ED wnl.  Admission requested for syncope w/u.  Will admit obs tele.

## 2017-07-06 DIAGNOSIS — R55 Syncope and collapse: Secondary | ICD-10-CM | POA: Diagnosis not present

## 2017-07-06 LAB — CBC
HEMATOCRIT: 38 % — AB (ref 39.0–52.0)
HEMOGLOBIN: 12.4 g/dL — AB (ref 13.0–17.0)
MCH: 34 pg (ref 26.0–34.0)
MCHC: 32.6 g/dL (ref 30.0–36.0)
MCV: 104.1 fL — ABNORMAL HIGH (ref 78.0–100.0)
Platelets: 192 10*3/uL (ref 150–400)
RBC: 3.65 MIL/uL — ABNORMAL LOW (ref 4.22–5.81)
RDW: 13.6 % (ref 11.5–15.5)
WBC: 8.8 10*3/uL (ref 4.0–10.5)

## 2017-07-06 LAB — COMPREHENSIVE METABOLIC PANEL
ALBUMIN: 3.7 g/dL (ref 3.5–5.0)
ALT: 14 U/L — ABNORMAL LOW (ref 17–63)
ANION GAP: 8 (ref 5–15)
AST: 17 U/L (ref 15–41)
Alkaline Phosphatase: 56 U/L (ref 38–126)
BILIRUBIN TOTAL: 0.6 mg/dL (ref 0.3–1.2)
BUN: 12 mg/dL (ref 6–20)
CO2: 24 mmol/L (ref 22–32)
Calcium: 8.8 mg/dL — ABNORMAL LOW (ref 8.9–10.3)
Chloride: 103 mmol/L (ref 101–111)
Creatinine, Ser: 0.94 mg/dL (ref 0.61–1.24)
GFR calc Af Amer: >60 mL/min (ref >60)
GLUCOSE: 104 mg/dL — AB (ref 65–99)
POTASSIUM: 4.2 mmol/L (ref 3.5–5.1)
Sodium: 135 mmol/L (ref 135–145)
TOTAL PROTEIN: 7.1 g/dL (ref 6.5–8.1)

## 2017-07-06 NOTE — Discharge Summary (Signed)
Physician Discharge Summary  Erik MaltaDonald A Maura GGE:[id number] DOB: 02/12/1961 DOA: 07/05/2017  [facility]  Admit date: 07/05/2017 Discharge date: 07/06/2017  Admitted From: Home Disposition:  Home  Recommendations for Outpatient Follow-up:  1. Follow up with PCP in 1 week  Discharge Condition: Stable CODE STATUS: Full  Diet recommendation: Heart healthy   Brief/Interim Summary: From H&P by Dr. Lowell GuitarPowell: Erik Olson is a 57 y.o. male with medical history significant of PTSD and anxiety presenting with an episode of syncope. He thinks that his symptoms started after he had a bad reaction to an iron supplement.  He notes that after he took the iron supplement he felt like he was freezing all night long and had an abnormal sensation.  He notes that he woke up this morning exhausted.  He sat up on the edge of the bed leaning forward to tie his shoes and then he passed out.  The episode lasted about 15 seconds.  He was orthostatic for EMS and got a 500 cc bolus.  In addition to the episode of syncope, he notes several chronic issues.  He notes that his hands feel cold all the time and he feels that his immune system has been poor recently and that he has had many recent infections.  He notes that he has been dealing with anemia since last September.  He notes that he has been exhausted since that time.  He denies any chest pain, fevers.  He does note some shortness of breath that occurred last night when he felt like there is some tightness in his throat, but that resolved.  He notes that he has fainted before in the past, but the last occurrence was years ago.  Interim: Patient's orthostatic hypotension resolved with IV fluids.  He states that he has been worked up extensively with EGD, colonoscopy, CT abdomen and pelvis for his chronic anemia.  All of [facility].  He states that [facility]   His hemoglobin level has remained stable during his hospitalization, FOBT negative.    Discharge Diagnoses:  Active Problems:   Syncope   Syncope:  Suspect orthostatic vs reflex given it occurred when he leaned over.    Orthostatic hypotension has resolved.  BP remained stable today.  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  PTSD, Anxiety: continue xanax  Chronic macrocytic anemia: Unknown etiology.  Previously worked up without any GI findings.  Has an outpatient appointment with hematology in a couple months.  FOBT negative    Discharge Instructions  Discharge Instructions    Call MD for:  difficulty breathing, headache or visual disturbances   Complete by:  As directed    Call MD for:  extreme fatigue   Complete by:  As directed    Call MD for:  hives   Complete by:  As directed    Call MD for:  persistant dizziness or light-headedness   Complete by:  As directed    Call MD for:  persistant nausea and vomiting   Complete by:  As directed    Call MD for:  severe uncontrolled pain   Complete by:  As directed    Call MD for:  temperature >100.4   Complete by:  As directed    Diet general   Complete by:  As  directed    Discharge instructions   Complete by:  As directed    You were cared for by a hospitalist during your hospital stay. If you have any questions about your discharge medications or the care you received while you were in the hospital after you are discharged, you can call the unit and asked to speak with the hospitalist on call if the hospitalist that took care of you is not available. Once you are discharged, your primary care physician will handle any further medical issues. Please note that NO REFILLS for any discharge medications will be authorized once you are discharged, as it is imperative that you return to your primary care physician (or establish a relationship with a primary care  physician if you do not have one) for your aftercare needs so that they can reassess your need for medications and monitor your lab values.   Increase activity slowly   Complete by:  As directed      Allergies as of 07/06/2017      Reactions   Penicillins Anaphylaxis   Has patient had a PCN reaction causing immediate rash, facial/tongue/throat swelling, SOB or lightheadedness with hypotension: Yes Has patient had a PCN reaction causing severe rash involving mucus membranes or skin necrosis: Yes Has patient had a PCN reaction that required hospitalization: Yes Has patient had a PCN reaction occurring within the last 10 years: no/ If all of the above answers are "NO", then may proceed with Cephalosporin use.      Medication List    TAKE these medications   ALPRAZolam 0.5 MG tablet Commonly known as:  XANAX Take 0.5 mg by mouth every 4 (four) hours.   esomeprazole 40 MG capsule Commonly known as:  NEXIUM Take 1 capsule (40 mg total) by mouth daily.   gabapentin 300 MG capsule Commonly known as:  NEURONTIN Take 300 mg by mouth 4 (four) times daily.   gabapentin 400 MG capsule Commonly known as:  NEURONTIN Take 400 mg by mouth 4 (four) times daily.   ranitidine 150 MG tablet Commonly known as:  ZANTAC Take 150 mg by mouth daily.      Follow-up Information    [facility] Schedule an appointment as soon as possible for a visit in 1 week(s).   Contact information: [address] [facility] [place] [address] [phone]          Allergies  Allergen Reactions  . Penicillins Anaphylaxis    Has patient had a PCN reaction causing immediate rash, facial/tongue/throat swelling, SOB or lightheadedness with hypotension: Yes Has patient had a PCN reaction causing severe rash involving mucus membranes or skin necrosis: Yes Has patient had a PCN reaction that required hospitalization: Yes Has patient had a PCN reaction occurring within the last 10 years:  no/ If all of the above answers are "NO", then may proceed with Cephalosporin use.     Consultations:  None    Procedures/Studies: Dg Chest 2 View  Result Date: 07/05/2017 CLINICAL DATA:  Pt with anemia x 7 months; some SOB; low bp; passed out at home today; bradycardia EXAM: CHEST - 2 VIEW COMPARISON:  Chest x-ray dated 05/12/2017. FINDINGS: The heart size and mediastinal contours are within normal limits. Both lungs are clear. The visualized skeletal structures are unremarkable. IMPRESSION: No active cardiopulmonary disease. Electronically Signed   By: Bary Richard M.D.   On: 07/05/2017 14:38       Discharge Exam: Vitals:   07/06/17 0519 07/06/17 0521  BP:  127/79 122/80  Pulse: 66 79  Resp:    Temp:    SpO2: 98% 98%    General: Pt is alert, awake, not in acute distress Cardiovascular: RRR, S1/S2 +, no rubs, no gallops Respiratory: CTA bilaterally, no wheezing, no rhonchi Abdominal: Soft, NT, ND, bowel sounds + Extremities: no edema, no cyanosis     The results of significant diagnostics from this hospitalization (including imaging, microbiology, ancillary and laboratory) are listed below for reference.     Microbiology: No results found for this or any previous visit (from the past 240 hour(s)).   Labs: BNP (last 3 results) No results for input(s): BNP in the last 8760 hours. Basic Metabolic Panel: Recent Labs  Lab 07/05/17 1239 07/06/17 0645  NA 135 135  K 4.0 4.2  CL 105 103  CO2 25 24  GLUCOSE 91 104*  BUN 14 12  CREATININE 0.87 0.94  CALCIUM 8.5* 8.8*   Liver Function Tests: Recent Labs  Lab 07/06/17 0645  AST 17  ALT 14*  ALKPHOS 56  BILITOT 0.6  PROT 7.1  ALBUMIN 3.7   No results for input(s): LIPASE, AMYLASE in the last 168 hours. No results for input(s): AMMONIA in the last 168 hours. CBC: Recent Labs  Lab 07/05/17 1239 07/06/17 0645  WBC 7.5 8.8  HGB 12.6* 12.4*  HCT 37.1* 38.0*  MCV 102.8* 104.1*  PLT 193 192   Cardiac  Enzymes: No results for input(s): CKTOTAL, CKMB, CKMBINDEX, TROPONINI in the last 168 hours. BNP: Invalid input(s): POCBNP CBG: No results for input(s): GLUCAP in the last 168 hours. D-Dimer No results for input(s): DDIMER in the last 72 hours. Hgb A1c No results for input(s): HGBA1C in the last 72 hours. Lipid Profile No results for input(s): CHOL, HDL, LDLCALC, TRIG, CHOLHDL, LDLDIRECT in the last 72 hours. Thyroid function studies Recent Labs    07/05/17 1923  TSH 0.772   Anemia work up No results for input(s): VITAMINB12, FOLATE, FERRITIN, TIBC, IRON, RETICCTPCT in the last 72 hours. Urinalysis    Component Value Date/Time   COLORURINE YELLOW 07/05/2017 1239   APPEARANCEUR CLEAR 07/05/2017 1239   LABSPEC <1.005 (L) 07/05/2017 1239   PHURINE 6.0 07/05/2017 1239   GLUCOSEU NEGATIVE 07/05/2017 1239   HGBUR NEGATIVE 07/05/2017 1239   BILIRUBINUR NEGATIVE 07/05/2017 1239   BILIRUBINUR neg 09/03/2010 1348   KETONESUR NEGATIVE 07/05/2017 1239   PROTEINUR NEGATIVE 07/05/2017 1239   UROBILINOGEN 0.2 10/22/2012 0914   NITRITE NEGATIVE 07/05/2017 1239   LEUKOCYTESUR NEGATIVE 07/05/2017 1239   Sepsis Labs Invalid input(s): PROCALCITONIN,  WBC,  LACTICIDVEN Microbiology No results found for this or any previous visit (from the past 240 hour(s)).   Patient was seen and examined on the day of discharge and was found to be in stable condition. Time coordinating discharge: 25 minutes including assessment and coordination of care, as well as examination of the patient.   SIGNED:  Noralee Stain, DO [facility] Pager [phone]  If 7PM-7AM, please contact night-coverage [email or web address] Password [facility] 07/06/2017, 10:25 AM

## 2017-07-20 ENCOUNTER — Encounter (HOSPITAL_BASED_OUTPATIENT_CLINIC_OR_DEPARTMENT_OTHER): Payer: Self-pay | Admitting: Emergency Medicine

## 2017-07-20 ENCOUNTER — Emergency Department (HOSPITAL_BASED_OUTPATIENT_CLINIC_OR_DEPARTMENT_OTHER)
Admission: EM | Admit: 2017-07-20 | Discharge: 2017-07-20 | Disposition: A | Discharge status: Home / Self Care | Payer: Non-veteran care | Attending: Emergency Medicine | Admitting: Emergency Medicine

## 2017-07-20 DIAGNOSIS — D649 Anemia, unspecified: Secondary | ICD-10-CM | POA: Insufficient documentation

## 2017-07-20 DIAGNOSIS — R002 Palpitations: Secondary | ICD-10-CM | POA: Diagnosis present

## 2017-07-20 DIAGNOSIS — T452X5A Adverse effect of vitamins, initial encounter: Secondary | ICD-10-CM | POA: Diagnosis not present

## 2017-07-20 DIAGNOSIS — T7840XA Allergy, unspecified, initial encounter: Secondary | ICD-10-CM

## 2017-07-20 NOTE — ED Provider Notes (Signed)
[facility] EMERGENCY DEPARTMENT Provider Note   CSN: [id number] Arrival date & time: 07/20/17  1540     History   Chief Complaint Chief Complaint  Patient presents with  . Allergic Reaction    HPI Erik Olson is a 57 y.o. male.  Patient who is currently undergoing extensive work-[facility] for weight loss, fatigue, anemia, [facility] -- presents with complaint of possible allergic reaction after taking his first dose of vitamin D supplementation today.  Patient took a dose of 50,000 units vitamin D at lunchtime.  He states that when he returned to work after taking this, his face became flushed.  He reports having elevated heart rate.  Typically has a sinus bradycardia.  He called a nurse at the [place] who recommended evaluation in the emergency department because tachycardia could be a sign of anaphylaxis.  Patient also had nausea but no vomiting.  He denies lip or tongue swelling, trouble breathing, vomiting, diarrhea, skin rash.  He currently has only residual nausea but this is improving. No treatments PTA.  The onset of this condition was acute. Aggravating factors: none. Alleviating factors: none.       Past Medical History:  Diagnosis Date  . Anemia   . Anxiety   . Bradycardia   . Chest pain   . Depression   . Narrowing of intervertebral disc space    C6-7  . Numbness and tingling in right hand   . PTSD (post-traumatic stress disorder)     Patient Active Problem List   Diagnosis Date Noted  . Syncope 07/05/2017  . Narrowing of intervertebral disc space 11/04/2016  . Anemia 11/03/2016  . Chest pain 11/03/2016  . Numbness and tingling in right hand 11/03/2016  . Bradycardia 11/03/2016  . PTSD (post-traumatic stress disorder)   . Normochromic normocytic anemia   . Gastroenteritis 09/03/2010  . ACUTE PHARYNGITIS 05/29/2009  . FATIGUE 05/29/2009  . DIZZINESS 01/30/2009  . CHEST PAIN UNSPECIFIED 01/30/2009  . Alcohol abuse 06/29/2008  . SYNCOPE  06/29/2008  . Anxiety state 01/26/2007  . DEPRESSION 12/31/2006    Past Surgical History:  Procedure Laterality Date  . COLONOSCOPY    . WISDOM TOOTH EXTRACTION          Home Medications    Prior to Admission medications   Medication Sig Start Date End Date Taking? Authorizing Provider  ALPRAZolam Prudy Feeler) 0.5 MG tablet Take 0.5 mg by mouth every 4 (four) hours.      [provider]  esomeprazole (NEXIUM) 40 MG capsule Take 1 capsule (40 mg total) by mouth daily. 12/08/16   Caccavale, Sophia, PA-C  gabapentin (NEURONTIN) 300 MG capsule Take 300 mg by mouth 4 (four) times daily.      [provider]  gabapentin (NEURONTIN) 400 MG capsule Take 400 mg by mouth 4 (four) times daily.    [provider]  ranitidine (ZANTAC) 150 MG tablet Take 150 mg by mouth daily.    [provider]    Family History Family History  Problem Relation Age of Onset  . Breast cancer Mother   . Suicidality Brother     Social History Social History   Tobacco Use  . Smoking status: Never Smoker  . Smokeless tobacco: Current User    Types: Snuff  Substance Use Topics  . Alcohol use: No  . Drug use: No     Allergies   Penicillins   Review of Systems Review of Systems  Constitutional: Negative  for fever.  HENT: Negative for facial swelling and trouble swallowing.        +flushing of the face  Eyes: Negative for redness.  Respiratory: Negative for shortness of breath, wheezing and stridor.   Cardiovascular: Positive for palpitations. Negative for chest pain.  Gastrointestinal: Positive for nausea. Negative for vomiting.  Musculoskeletal: Negative for myalgias.  Skin: Negative for rash.  Neurological: Negative for light-headedness.  Psychiatric/Behavioral: Negative for confusion.     Physical Exam Updated Vital Signs BP 133/84 (BP Location: Left Arm)   Pulse 63   Temp 98.4 F (36.9 C)   Resp 16   SpO2 98%   Physical Exam  Constitutional: He  appears well-developed and well-nourished.  HENT:  Head: Normocephalic and atraumatic.  Mouth/Throat: Oropharynx is clear and moist.  No lip or tongue swelling.  No current facial flushing that is significant.  Eyes: Conjunctivae are normal. Right eye exhibits no discharge. Left eye exhibits no discharge.  Neck: Normal range of motion. Neck supple.  Cardiovascular: Regular rhythm and normal heart sounds. Bradycardia present.  No murmur heard. Pulmonary/Chest: Effort normal and breath sounds normal. No respiratory distress. He has no wheezes. He has no rales.  Abdominal: Soft. There is no tenderness. There is no rebound and no guarding.  Neurological: He is alert.  Skin: Skin is warm and dry.  Psychiatric: He has a normal mood and affect.  Nursing note and vitals reviewed.    ED Treatments / Results  Labs (all labs ordered are listed, but only abnormal results are displayed) Labs Reviewed - No data to display  ED ECG REPORT   Date: 07/20/2017  Rate: 63  Rhythm: normal sinus rhythm  QRS Axis: normal  Intervals: normal  ST/T Wave abnormalities: normal  Conduction Disutrbances:none  Narrative Interpretation:   Old EKG Reviewed: faster today  I have personally reviewed the EKG tracing and agree with the computerized printout as noted.  Radiology No results found.  Procedures Procedures (including critical care time)  Medications Ordered in ED Medications - No data to display   Initial Impression / Assessment and Plan / ED Course  I have reviewed the triage vital signs and the nursing notes.  Pertinent labs & imaging results that were available during my care of the patient were reviewed by me and considered in my medical decision making (see chart for details).     Patient seen and examined.  At this point patient is almost nearly asymptomatic.  No signs of anaphylaxis.  I suspect he had an intolerance to this medication.  He will follow up with his primary care  doctor regarding what to do.  Vital signs reviewed and are as follows: BP 133/84 (BP Location: Left Arm)   Pulse 63   Temp 98.4 F (36.9 C)   Resp 16   SpO2 98%   He is not due to take another dose of this medication for 1 week.  Encouraged patient to call the ambulance with lip or tongue swelling, syncope, other severe reaction in the future.  Patient instructed to use over-the-counter antihistamine as needed for minor symptoms.  Final Clinical Impressions(s) / ED Diagnoses   Final diagnoses:  Allergic reaction, initial encounter   Patient with medication intolerance to vitamin D.  No signs of anaphylaxis here today.  Normal EKG.  Comfortable with discharge to home with continued work-up by his caregivers.  ED Discharge Orders    None       Renne Crigler, [place] 07/20/17 1735  Terrilee Files, MD 07/22/17 [phone]

## 2017-07-20 NOTE — Discharge Instructions (Signed)
Please read and follow all provided instructions.  Your diagnoses today include:  1. Allergic reaction, initial encounter     Tests performed today include:  Vital signs. See below for your results today.   Medications prescribed:   None  Take any prescribed medications only as directed.  Home care instructions:   Follow any educational materials contained in this packet  He may use an over-the-counter antihistamine such as Claritin, Zyrtec, or Benadryl if you have any residual symptoms  Follow-up instructions: Please follow-up with your primary care provider as planned for further evaluation of your symptoms.   Return instructions:   Please return to the Emergency Department if you experience worsening symptoms.   Call 9-1-1 immediately if you have an allergic reaction that involves your lips, mouth, throat or if you have any difficulty breathing. This is a life-threatening emergency.   Please return if you have any other emergent concerns.  Additional Information:  Your vital signs today were: BP 133/84 (BP Location: Left Arm)    Pulse 63    Temp 98.4 F (36.9 C)    Resp 16    SpO2 98%  If your blood pressure (BP) was elevated above 135/85 this visit, please have this repeated by your doctor within one month. --------------

## 2017-07-20 NOTE — ED Triage Notes (Signed)
States has lost a lot of weight 265- 183 in about a 1  1/2 year and now he xcannot keep weight on [facility] , he has copy  Of them took a 50, o000 vit D today after eating  Today , he became flushed  And felt heart racing got light headed,  States started at 1 pm , feels better now but nauseaous

## 2018-01-20 ENCOUNTER — Emergency Department (HOSPITAL_COMMUNITY): Payer: No Typology Code available for payment source

## 2018-01-20 ENCOUNTER — Emergency Department (HOSPITAL_COMMUNITY)
Admission: EM | Admit: 2018-01-20 | Discharge: 2018-01-20 | Disposition: A | Discharge status: Home / Self Care | Payer: No Typology Code available for payment source | Attending: Emergency Medicine | Admitting: Emergency Medicine

## 2018-01-20 ENCOUNTER — Other Ambulatory Visit (HOSPITAL_COMMUNITY): Payer: Non-veteran care

## 2018-01-20 DIAGNOSIS — R531 Weakness: Secondary | ICD-10-CM | POA: Diagnosis present

## 2018-01-20 DIAGNOSIS — F4312 Post-traumatic stress disorder, chronic: Secondary | ICD-10-CM | POA: Diagnosis not present

## 2018-01-20 DIAGNOSIS — I639 Cerebral infarction, unspecified: Secondary | ICD-10-CM

## 2018-01-20 DIAGNOSIS — R55 Syncope and collapse: Secondary | ICD-10-CM | POA: Insufficient documentation

## 2018-01-20 DIAGNOSIS — F419 Anxiety disorder, unspecified: Secondary | ICD-10-CM | POA: Diagnosis not present

## 2018-01-20 DIAGNOSIS — Z79899 Other long term (current) drug therapy: Secondary | ICD-10-CM | POA: Insufficient documentation

## 2018-01-20 DIAGNOSIS — F449 Dissociative and conversion disorder, unspecified: Secondary | ICD-10-CM | POA: Diagnosis not present

## 2018-01-20 DIAGNOSIS — R0602 Shortness of breath: Secondary | ICD-10-CM | POA: Diagnosis not present

## 2018-01-20 LAB — COMPREHENSIVE METABOLIC PANEL
ALBUMIN: 3.6 g/dL (ref 3.5–5.0)
ALT: 19 U/L (ref 0–44)
ANION GAP: 7 (ref 5–15)
AST: 26 U/L (ref 15–41)
Alkaline Phosphatase: 60 U/L (ref 38–126)
BILIRUBIN TOTAL: 0.6 mg/dL (ref 0.3–1.2)
BUN: 13 mg/dL (ref 6–20)
CO2: 24 mmol/L (ref 22–32)
Calcium: 8.9 mg/dL (ref 8.9–10.3)
Chloride: 104 mmol/L (ref 98–111)
Creatinine, Ser: 1 mg/dL (ref 0.61–1.24)
GFR calc Af Amer: >60 mL/min (ref >60)
GFR calc non Af Amer: >60 mL/min (ref >60)
GLUCOSE: 90 mg/dL (ref 70–99)
POTASSIUM: 3.9 mmol/L (ref 3.5–5.1)
SODIUM: 135 mmol/L (ref 135–145)
TOTAL PROTEIN: 6.2 g/dL — AB (ref 6.5–8.1)

## 2018-01-20 LAB — DIFFERENTIAL
Abs Immature Granulocytes: 0.01 10*3/uL (ref 0.00–0.07)
Basophils Absolute: 0 10*3/uL (ref 0.0–0.1)
Basophils Relative: 1 %
EOS ABS: 0.2 10*3/uL (ref 0.0–0.5)
EOS PCT: 5 %
IMMATURE GRANULOCYTES: 0 %
Lymphocytes Relative: 32 %
Lymphs Abs: 1.3 10*3/uL (ref 0.7–4.0)
MONO ABS: 0.4 10*3/uL (ref 0.1–1.0)
Monocytes Relative: 10 %
Neutro Abs: 2.1 10*3/uL (ref 1.7–7.7)
Neutrophils Relative %: 52 %

## 2018-01-20 LAB — TROPONIN I: Troponin I: <0.03 ng/mL (ref <0.03)

## 2018-01-20 LAB — D-DIMER, QUANTITATIVE (NOT AT ARMC): D DIMER QUANT: 0.48 ug{FEU}/mL (ref 0.00–0.50)

## 2018-01-20 LAB — I-STAT CHEM 8, ED
BUN: 14 mg/dL (ref 6–20)
CALCIUM ION: 1.11 mmol/L — AB (ref 1.15–1.40)
Chloride: 101 mmol/L (ref 98–111)
Creatinine, Ser: 1.1 mg/dL (ref 0.61–1.24)
Glucose, Bld: 90 mg/dL (ref 70–99)
HCT: 38 % — ABNORMAL LOW (ref 39.0–52.0)
Hemoglobin: 12.9 g/dL — ABNORMAL LOW (ref 13.0–17.0)
Potassium: 3.9 mmol/L (ref 3.5–5.1)
SODIUM: 137 mmol/L (ref 135–145)
TCO2: 26 mmol/L (ref 22–32)

## 2018-01-20 LAB — CBC
HCT: 39.1 % (ref 39.0–52.0)
Hemoglobin: 12.9 g/dL — ABNORMAL LOW (ref 13.0–17.0)
MCH: 33.9 pg (ref 26.0–34.0)
MCHC: 33 g/dL (ref 30.0–36.0)
MCV: 102.9 fL — ABNORMAL HIGH (ref 80.0–100.0)
NRBC: 0 % (ref 0.0–0.2)
PLATELETS: 210 10*3/uL (ref 150–400)
RBC: 3.8 MIL/uL — ABNORMAL LOW (ref 4.22–5.81)
RDW: 12.7 % (ref 11.5–15.5)
WBC: 4 10*3/uL (ref 4.0–10.5)

## 2018-01-20 LAB — I-STAT TROPONIN, ED: Troponin i, poc: 0.01 ng/mL (ref 0.00–0.08)

## 2018-01-20 LAB — PROTIME-INR
INR: 1.03
PROTHROMBIN TIME: 13.4 s (ref 11.4–15.2)

## 2018-01-20 LAB — APTT: APTT: 28 s (ref 24–36)

## 2018-01-20 MED ORDER — IOPAMIDOL (ISOVUE-370) INJECTION 76%
50.0000 mL | Freq: Once | INTRAVENOUS | Status: AC | PRN
  Administered 2018-01-20: 50 mL via INTRAVENOUS

## 2018-01-20 NOTE — ED Notes (Signed)
Ambulated pt in hallway. Pt tolerated walk well.

## 2018-01-20 NOTE — ED Notes (Signed)
Patient transported to MRI 

## 2018-01-20 NOTE — ED Provider Notes (Signed)
[facility] EMERGENCY DEPARTMENT Provider Note   CSN: [id number] Arrival date & time: 01/20/18  1145     History   Chief Complaint Chief Complaint  Patient presents with  . Code Stroke    HPI Erik Olson is a 56 y.o. male with medical history significant for anxiety, depression, PTSD and alcohol abuse presenting for evaluation of syncope.  History was obtained by EMS as patient had a phasic speech.  At around 10:40 AM, patient's wife called EMS to report that he was experiencing shortness of breath and later became unresponsive.  During EMS ride to the emergency department patient partially regained consciousness but however was altered.  On arrival to the ED patient had noticeable left-sided weakness at the upper and lower extremity, a phasic speech, slurred speech and code stroke was activated.  After patient undergone CT head, he was evaluated by neurology and was noted to have regained his strength on his left side and had improvement in his speech.  Upon further questioning of patient, he reports that he has a history of toxic alcohol neuropathy and his pain has been gradually getting worse in the past 3 weeks for which she follows up at the [facility].  He states that last night he had a nightmare and subsequently felt short of breath and later lost consciousness.  He denies chest pain, palpitation, seizure-like activities, headache, dizziness, lightheadedness, nausea, vomiting.   Past Medical History:  Diagnosis Date  . Anemia   . Anxiety   . Bradycardia   . Chest pain   . Depression   . Narrowing of intervertebral disc space    C6-7  . Numbness and tingling in right hand   . PTSD (post-traumatic stress disorder)     Patient Active Problem List   Diagnosis Date Noted  . Syncope 07/05/2017  . Narrowing of intervertebral disc space 11/04/2016  . Anemia 11/03/2016  . Chest pain 11/03/2016  . Numbness and tingling in right hand 11/03/2016  .  Bradycardia 11/03/2016  . PTSD (post-traumatic stress disorder)   . Normochromic normocytic anemia   . Gastroenteritis 09/03/2010  . ACUTE PHARYNGITIS 05/29/2009  . FATIGUE 05/29/2009  . DIZZINESS 01/30/2009  . CHEST PAIN UNSPECIFIED 01/30/2009  . Alcohol abuse 06/29/2008  . SYNCOPE 06/29/2008  . Anxiety state 01/26/2007  . DEPRESSION 12/31/2006    Past Surgical History:  Procedure Laterality Date  . COLONOSCOPY    . WISDOM TOOTH EXTRACTION          Home Medications    Prior to Admission medications   Medication Sig Start Date End Date Taking? Authorizing Provider  ALPRAZolam Prudy Feeler) 0.5 MG tablet Take 0.5 mg by mouth every 4 (four) hours.      [provider]  esomeprazole (NEXIUM) 40 MG capsule Take 1 capsule (40 mg total) by mouth daily. 12/08/16   Caccavale, Sophia, PA-C  gabapentin (NEURONTIN) 300 MG capsule Take 300 mg by mouth 4 (four) times daily.      [provider]  gabapentin (NEURONTIN) 400 MG capsule Take 400 mg by mouth 4 (four) times daily.    [provider]  ranitidine (ZANTAC) 150 MG tablet Take 150 mg by mouth daily.    [provider]    Family History Family History  Problem Relation Age of Onset  . Breast cancer Mother   . Suicidality Brother     Social History Social History   Tobacco Use  . Smoking status: Never Smoker  . Smokeless  tobacco: Current User    Types: Snuff  Substance Use Topics  . Alcohol use: No  . Drug use: No     Allergies   Penicillins   Review of Systems Review of Systems  Constitutional: Negative.   HENT: Negative.   Eyes: Negative.   Respiratory: Positive for shortness of breath.   Cardiovascular: Negative.   Gastrointestinal: Negative.   Endocrine: Negative.   Genitourinary: Negative.   Musculoskeletal: Negative.   Skin: Negative.   Neurological: Positive for syncope.  Psychiatric/Behavioral: The patient is nervous/anxious.      Physical Exam Updated Vital  Signs BP 115/75   Pulse (!) 55   Resp 15   SpO2 96%   Physical Exam  Constitutional: He is oriented to person, place, and time. He appears well-developed and well-nourished.  HENT:  Head: Normocephalic and atraumatic.  Eyes: Conjunctivae and EOM are normal.  Neck: Normal range of motion. Neck supple.  Cardiovascular: Normal rate, regular rhythm, normal heart sounds and intact distal pulses. Exam reveals no gallop and no friction rub.  No murmur heard. Pulmonary/Chest: Effort normal and breath sounds normal. No respiratory distress. He has no wheezes. He has no rales.  Abdominal: Soft. Bowel sounds are normal. He exhibits no distension. There is no tenderness.  Musculoskeletal: Normal range of motion.  Neurological: He is alert and oriented to person, place, and time. Coordination normal.  Alert and oriented to place, time, person  Cranial nerves II to XII intact with exception of moderately decreased strength on the left upper lower extremity -Left upper extremity motor strength: 4/5 -Left lower extremity motor strength: 4/5 -Right upper extremity motor strength: 5/5 -Right lower extremity motor strength: 5/5      ED Treatments / Results  Labs (all labs ordered are listed, but only abnormal results are displayed) Labs Reviewed  CBC - Abnormal; Notable for the following components:      Result Value   RBC 3.80 (*)    Hemoglobin 12.9 (*)    MCV 102.9 (*)    All other components within normal limits  COMPREHENSIVE METABOLIC PANEL - Abnormal; Notable for the following components:   Total Protein 6.2 (*)    All other components within normal limits  I-STAT CHEM 8, ED - Abnormal; Notable for the following components:   Calcium, Ion 1.11 (*)    Hemoglobin 12.9 (*)    HCT 38.0 (*)    All other components within normal limits  PROTIME-INR  APTT  DIFFERENTIAL  TROPONIN I  D-DIMER, QUANTITATIVE (NOT AT [facility])  I-STAT TROPONIN, ED  CBG MONITORING, ED    EKG EKG  Interpretation  Date/Time:  Wednesday January 20 2018 12:53:40 EDT Ventricular Rate:  50 PR Interval:    QRS Duration: 120 QT Interval:  439 QTC Calculation: 401 R Axis:   20 Text Interpretation:  Sinus rhythm Nonspecific intraventricular conduction delay , new since last tracing Confirmed by Linwood Dibbles [id number]) on 01/20/2018 12:58:01 PM   Radiology Ct Angio Head W Or Wo Contrast  Result Date: 01/20/2018 CLINICAL DATA:  57 year old male code stroke. Left side weakness. EXAM: CT ANGIOGRAPHY HEAD AND NECK TECHNIQUE: Multidetector CT imaging of the head and neck was performed using the standard protocol during bolus administration of intravenous contrast. Multiplanar CT image reconstructions and MIPs were obtained to evaluate the vascular anatomy. Carotid stenosis measurements (when applicable) are obtained utilizing NASCET criteria, using the distal internal carotid diameter as the denominator. CONTRAST:  50mL ISOVUE-370 IOPAMIDOL (ISOVUE-370) INJECTION 76% COMPARISON:  None.  FINDINGS: CTA NECK Skeleton: Degenerative changes in the cervical spine. No acute osseous abnormality identified. Upper chest: Negative lung apices. No superior mediastinal lymphadenopathy. Other neck: Negative; no neck mass or lymphadenopathy. Aortic arch: 3 vessel arch configuration. No arch atherosclerosis. Right carotid system: Negative. Left carotid system: Negative. Vertebral arteries: Normal proximal right subclavian artery. The right vertebral artery is highly diminutive from its origin and appears non dominant with correspondingly small right cervical transverse foramen. The thread-like right vertebral remains patent to the C1 level. Normal proximal left subclavian artery and left vertebral artery origin. Dominant left vertebral artery arrival is a size of an ICA and is patent to the skull base without stenosis. CTA HEAD Posterior circulation: Only faint enhancement is evident in a tiny right vertebral artery V4 segment.  The dominant distal left vertebral artery is normal and supplies the basilar. Both AICAs may be dominant. The basilar artery is patent without stenosis normal SCA and PCA origins. Left posterior communicating artery is present, the right is diminutive or absent. However, the left PCA branches may be independently supplied from the left Pcomm and the left P1. No left PCA branch occlusion suspected. The right PCA branches appear normal. Anterior circulation: Both ICA siphons are patent. No ICA plaque or stenosis. Normal ophthalmic and left posterior communicating artery origins. Patent carotid termini. Normal MCA and ACA origins. Anterior communicating artery and bilateral ACA branches are within normal limits. Left MCA M1 segment, bifurcation, and left MCA branches are within normal limits. Right MCA M1 segment, bifurcation, and right MCA branches are within normal limits. Venous sinuses: Patent. Anatomic variants: Dominant left and diminutive right vertebral arteries. The left supplies the basilar and the right vertebral functionally terminates at C1. distal left PCA branches appear appear independently supplied by the left posterior communicating artery and left P1. Review of the MIP images confirms the above findings IMPRESSION: 1. Negative for large vessel occlusion. 2. No atherosclerosis or stenosis in the head or neck. 3. Variant anatomy of the bilateral vertebral and left posterior cerebral arteries. These results were communicated to Dr. Amada Jupiter at 12:55 pmon 10/30/2019by text page via the [facility] messaging system. Electronically Signed   By: Odessa Fleming M.D.   On: 01/20/2018 12:55   Ct Angio Neck W Or Wo Contrast  Result Date: 01/20/2018 CLINICAL DATA:  57 year old male code stroke. Left side weakness. EXAM: CT ANGIOGRAPHY HEAD AND NECK TECHNIQUE: Multidetector CT imaging of the head and neck was performed using the standard protocol during bolus administration of intravenous contrast. Multiplanar CT  image reconstructions and MIPs were obtained to evaluate the vascular anatomy. Carotid stenosis measurements (when applicable) are obtained utilizing NASCET criteria, using the distal internal carotid diameter as the denominator. CONTRAST:  50mL ISOVUE-370 IOPAMIDOL (ISOVUE-370) INJECTION 76% COMPARISON:  None. FINDINGS: CTA NECK Skeleton: Degenerative changes in the cervical spine. No acute osseous abnormality identified. Upper chest: Negative lung apices. No superior mediastinal lymphadenopathy. Other neck: Negative; no neck mass or lymphadenopathy. Aortic arch: 3 vessel arch configuration. No arch atherosclerosis. Right carotid system: Negative. Left carotid system: Negative. Vertebral arteries: Normal proximal right subclavian artery. The right vertebral artery is highly diminutive from its origin and appears non dominant with correspondingly small right cervical transverse foramen. The thread-like right vertebral remains patent to the C1 level. Normal proximal left subclavian artery and left vertebral artery origin. Dominant left vertebral artery arrival is a size of an ICA and is patent to the skull base without stenosis. CTA HEAD Posterior circulation: Only faint enhancement  is evident in a tiny right vertebral artery V4 segment. The dominant distal left vertebral artery is normal and supplies the basilar. Both AICAs may be dominant. The basilar artery is patent without stenosis normal SCA and PCA origins. Left posterior communicating artery is present, the right is diminutive or absent. However, the left PCA branches may be independently supplied from the left Pcomm and the left P1. No left PCA branch occlusion suspected. The right PCA branches appear normal. Anterior circulation: Both ICA siphons are patent. No ICA plaque or stenosis. Normal ophthalmic and left posterior communicating artery origins. Patent carotid termini. Normal MCA and ACA origins. Anterior communicating artery and bilateral ACA branches  are within normal limits. Left MCA M1 segment, bifurcation, and left MCA branches are within normal limits. Right MCA M1 segment, bifurcation, and right MCA branches are within normal limits. Venous sinuses: Patent. Anatomic variants: Dominant left and diminutive right vertebral arteries. The left supplies the basilar and the right vertebral functionally terminates at C1. distal left PCA branches appear appear independently supplied by the left posterior communicating artery and left P1. Review of the MIP images confirms the above findings IMPRESSION: 1. Negative for large vessel occlusion. 2. No atherosclerosis or stenosis in the head or neck. 3. Variant anatomy of the bilateral vertebral and left posterior cerebral arteries. These results were communicated to Dr. Amada Jupiter at 12:55 pmon 10/30/2019by text page via the [facility] messaging system. Electronically Signed   By: Odessa Fleming M.D.   On: 01/20/2018 12:55   Ct Angio Chest Pe W Or Wo Contrast  Result Date: 01/20/2018 CLINICAL DATA:  Shortness of breath EXAM: CT ANGIOGRAPHY CHEST WITH CONTRAST TECHNIQUE: Multidetector CT imaging of the chest was performed using the standard protocol during bolus administration of intravenous contrast. Multiplanar CT image reconstructions and MIPs were obtained to evaluate the vascular anatomy. CONTRAST:  50mL ISOVUE-370 IOPAMIDOL (ISOVUE-370) INJECTION 76% COMPARISON:  Chest radiograph July 05, 2017 FINDINGS: Cardiovascular: There is no demonstrable pulmonary embolus. There is no thoracic aortic aneurysm or dissection. The visualized great vessels appear unremarkable. There is no appreciable pericardial effusion or pericardial thickening. Mediastinum/Nodes: Thyroid appears unremarkable. There is no appreciable thoracic adenopathy. There is a small hiatal hernia. Lungs/Pleura: No edema or consolidation. There are areas of slight atelectatic change bilaterally. No pleural effusion or pleural thickening evident. On axial slice 86  series 6, there is a 2 mm nodular opacity in the superior segment of the right lower lobe. On axial slice 113 series 6, there is a 5 mm nodular opacity abutting the pleura in the medial segment right middle lobe. On axial slice 138 series 6, there is a 3 mm nodular opacity abutting the pleura in the medial segment of the right middle lobe. On axial slice 38 series 6, there is a 2 mm nodular opacity in the left upper lobe near the junction of the anterior and posterior segments. Upper Abdomen: Visualized upper abdominal structures appear unremarkable. Musculoskeletal: There are no blastic or lytic bone lesions. No chest wall lesions are evident. Review of the MIP images confirms the above findings. IMPRESSION: 1. No demonstrable pulmonary embolus. No thoracic aortic aneurysm or dissection. 2. No edema or consolidation. Several nodular opacities, largest measuring 5 mm. No follow-up needed if patient is low-risk (and has no known or suspected primary neoplasm). Non-contrast chest CT can be considered in 12 months if patient is high-risk. This recommendation follows the consensus statement: Guidelines for Management of Incidental Pulmonary Nodules Detected on CT Images: From the Fleischner  Society 2017; Radiology 2017; [phone]. 3.  No demonstrable thoracic adenopathy. 4.  Small hiatal hernia. Electronically Signed   By: Bretta Bang III M.D.   On: 01/20/2018 13:17   Mr Brain Wo Contrast  Result Date: 01/20/2018 CLINICAL DATA:  Left-sided weakness following syncopal event. EXAM: MRI HEAD WITHOUT CONTRAST TECHNIQUE: Multiplanar, multiecho pulse sequences of the brain and surrounding structures were obtained without intravenous contrast. COMPARISON:  Head CT 01/20/2018 and MRI 07/25/2003 FINDINGS: Brain: There is no evidence of acute infarct, intracranial hemorrhage, mass, midline shift, or significant extra-axial fluid collection. The ventricles and sulci are normal. The brain is normal in signal. Slightly  asymmetric extra-axial CSF over the left cerebellar hemisphere is unchanged from the prior MRI and may reflect normal developmental variation. Vascular: Hypoplastic and poorly visualized distal right vertebral artery. Other major intracranial vascular flow voids are preserved. Skull and upper cervical spine: Unremarkable bone marrow signal. Sinuses/Orbits: Unremarkable orbits. Mild scattered paranasal sinus mucosal thickening. No significant mastoid fluid. Other: None. IMPRESSION: Negative brain MRI. Electronically Signed   By: Sebastian Ache M.D.   On: 01/20/2018 14:19   Ct Head Code Stroke Wo Contrast  Result Date: 01/20/2018 CLINICAL DATA:  Code stroke. 56 year old male with sudden onset weakness, witnessed syncope. Left side weakness. EXAM: CT HEAD WITHOUT CONTRAST TECHNIQUE: Contiguous axial images were obtained from the base of the skull through the vertex without intravenous contrast. COMPARISON:  Head CT without contrast 06/29/2008. FINDINGS: Brain: Normal cerebral volume. No midline shift, ventriculomegaly, mass effect, evidence of mass lesion, intracranial hemorrhage or evidence of cortically based acute infarction. Gray-white matter differentiation appears stable and within normal limits throughout the brain. Vascular: Dominant distal left vertebral artery. No suspicious intracranial vascular hyperdensity. Skull: Negative. Sinuses/Orbits: Visualized paranasal sinuses and mastoids are stable and well pneumatized. Other: Visualized orbits and scalp soft tissues are within normal limits. ASPECTS (Alberta Stroke Program Early CT Score) 7 - Ganglionic level infarction (caudate, lentiform nuclei, internal capsule, insula, M1-M3 cortex): - Supraganglionic infarction (M4-M6 cortex): 3 Total score (0-10 with 10 being normal): 10 IMPRESSION: 1. Stable and normal noncontrast CT appearance of the brain. 2. ASPECTS is 10. 3. These results were communicated to Dr. Amada Jupiter at 12:26 pmon 10/30/2019by text page via  the [facility] messaging system. Electronically Signed   By: Odessa Fleming M.D.   On: 01/20/2018 12:27    Procedures Procedures (including critical care time)  Medications Ordered in ED Medications  iopamidol (ISOVUE-370) 76 % injection 50 mL (50 mLs Intravenous Contrast Given 01/20/18 1238)     Initial Impression / Assessment and Plan / ED Course  I have reviewed the triage vital signs and the nursing notes.  Pertinent labs & imaging results that were available during my care of the patient were reviewed by me and considered in my medical decision making (see chart for details).   57 year old gentleman with medical history significant for anxiety, depression, PTSD, toxic chemical neuropathy and alcohol abuse presenting for evaluation of possible syncopal episode.  Patient reports that last night he had a nightmare, shortly after that he began experiencing shortness of breath, subsequently lost consciousness and developed numbness sided numbness.  Denied chest pain, palpitation, diaphoresis, lightheadedness, dizziness or seizure-like activities.  On arrival to the ED, he was noted to have weakness on his left upper and lower extremity.  Code stroke was activated.  Upon evaluation by neurology patient symptoms have significantly improved.  CT head without contrast was unremarkable, CT angiography of the head and neck reviewed no  evidence of large vessel occlusion, CT angiography chest revealed no evidence of pulmonary embolus, MRI brain was unremarkable.  On reevaluation, patient's speech had normalized, his strength on the left upper and lower extremity has significantly improved.  Cranial nerves II through XII were intact with no focal neurological deficits.  Upon speaking to neurology, patient has presumed diagnosis of conversion disorder with left-sided weakness.  He states to discharge and follow-up with the neurology and outpatient physical therapy.  Final Clinical Impressions(s) / ED Diagnoses     Final diagnoses:  Left-sided weakness  Chronic post-traumatic stress disorder (PTSD)  Anxiety    ED Discharge Orders    None       Yvette Rack, MD 01/20/18 1458    Gerhard Munch, MD 01/20/18 1540    Gerhard Munch, MD 01/20/18 [phone]

## 2018-01-20 NOTE — Code Documentation (Signed)
PT evaluated by this RN after patient was seen in CT by Neurologist and confirmed to be conversion disorder. Full NIHSS completed and noted to be 4 due to left arm weakness and left leg weakness along with subjective sensory decreased on the left. When performing exam, pt was able to tell me I was touching his left, but when asked if he could feel me touching, He stated, "No." Spoke with Neurology about exam. MD Aware. Consistent with exam in CT. Handoff given to Kathlene November, Charity fundraiser. Pt to get MRI and continue q2hour neuro checks.

## 2018-01-20 NOTE — ED Notes (Signed)
Applied condom catheter with standard drainage bag. Small size

## 2018-01-20 NOTE — Discharge Instructions (Addendum)
Mr. Erik Olson,  It was a pleasure taking care of you in the emergency department.  We did multiple CAT scans of your head and chest as well as MRI of your head which did not show that he had a stroke.  Some times you experience were most likely due to anxiety or something we call conversion disorder.   I will advised that he follow-up with your primary care doctor as well as the neurologist at the [facility].   [facility].   ~Take Care

## 2018-01-20 NOTE — Consult Note (Addendum)
Neurology Consultation  Reason for Consult: Code stroke Referring Physician: Jeraldine Loots  CC: Left-sided weakness  History is obtained from: Patient's wife, EMS, patient  HPI: Erik Olson is a 57 y.o. male with PTSD, chest pain, bradycardia and peripheral neuropathy.  Patient was apparently talking to his wife at approximately 1045 this morning when he suddenly complained of difficulty breathing and went unresponsive and was not moving either side.  Patient's wife called EMS.  Upon arrival patient continued to be unresponsive.  On the way into the hospital he began to become more responsive and they noted he was not moving his left side thus a code stroke was called once they arrived at the hospital.  Initially patient was not talking however while in the CT patient began to become more responsive and talking along with moving his left side.  Per EMS the wife did state that he has had an episode like this prior, however this was quite a long time ago, and this is when he was diagnosed with PTSD.   ED course  - CT head does not show any acute abnormalities -D-dimer 0.48 -Troponin pending    LKW: 1045 this morning on 01/20/2018 tpa given?: no, symptoms improving with no significant deficit Premorbid modified Rankin scale (mRS): 0 NIH stroke scale upon entering the hospital was 10   ROS Unable to obtain due to altered mental status.   Past Medical History:  Diagnosis Date  . Anemia   . Anxiety   . Bradycardia   . Chest pain   . Depression   . Narrowing of intervertebral disc space    C6-7  . Numbness and tingling in right hand   . PTSD (post-traumatic stress disorder)       Family History  Problem Relation Age of Onset  . Breast cancer Mother   . Suicidality Brother      Social History:   reports that he has never smoked. His smokeless tobacco use includes snuff. He reports that he does not drink alcohol or use drugs.  Medications No current facility-administered  medications for this encounter.   Current Outpatient Medications:  .  ALPRAZolam (XANAX) 0.5 MG tablet, Take 0.5 mg by mouth every 4 (four) hours.  , Disp: , Rfl:  .  esomeprazole (NEXIUM) 40 MG capsule, Take 1 capsule (40 mg total) by mouth daily., Disp: 30 capsule, Rfl: 0 .  gabapentin (NEURONTIN) 300 MG capsule, Take 300 mg by mouth 4 (four) times daily.  , Disp: , Rfl:  .  gabapentin (NEURONTIN) 400 MG capsule, Take 400 mg by mouth 4 (four) times daily., Disp: , Rfl:  .  ranitidine (ZANTAC) 150 MG tablet, Take 150 mg by mouth daily., Disp: , Rfl:    Exam: Current vital signs: There were no vitals taken for this visit. Vital signs in last 24 hours:    Physical Exam  Constitutional: Appears well-developed and well-nourished.  Psych: Affect appropriate to situation Eyes: No scleral injection HENT: No OP obstrucion Head: Normocephalic.  Cardiovascular: Normal rate and regular rhythm.  Respiratory: Effort normal, non-labored breathing GI: Soft.  No distension. There is no tenderness.  Skin: WDI  Neuro: Mental Status: Patient is awake, alert, oriented to person, place, month, year, and situation. Patient is able to give some information of history however the majority was from EMS No signs of aphasia or neglect speech is hypophonic with effort labile Cranial Nerves: II: Visual Fields are full. Pupils are equal, round, and reactive to light.  III,IV, VI: EOMI without ptosis or diploplia.  V: Facial sensation decreased on the left and splits midline to tuning fork VII: Facial movement is symmetric.  VIII: hearing is intact to voice X: Uvula elevates symmetrically XI: Shoulder shrug is symmetric. XII: tongue is midline without atrophy or fasciculations.  Motor: Tone is normal.  Left arm has full strength 5/5 however there is noted give way weakness, left leg has full 5/5 strength when he is distracted however when formal testing is done although he is pushing down he attempts  not to lift his leg Sensory: Splits midline from head down to abdomen with decreased sensation on the full left side Deep Tendon Reflexes: 2+ and symmetric in the biceps and patellae.  Plantars: Toes are downgoing bilaterally.  Cerebellar: Finger-to-nose shows no ataxia     Labs I have reviewed labs in epic and the results pertinent to this consultation are:   CBC    Component Value Date/Time   WBC 4.0 01/20/2018 1146   RBC 3.80 (L) 01/20/2018 1146   HGB 12.9 (L) 01/20/2018 1210   HCT 38.0 (L) 01/20/2018 1210   PLT 210 01/20/2018 1146   MCV 102.9 (H) 01/20/2018 1146   MCH 33.9 01/20/2018 1146   MCHC 33.0 01/20/2018 1146   RDW 12.7 01/20/2018 1146   LYMPHSABS 1.3 01/20/2018 1146   MONOABS 0.4 01/20/2018 1146   EOSABS 0.2 01/20/2018 1146   BASOSABS 0.0 01/20/2018 1146    CMP     Component Value Date/Time   NA 137 01/20/2018 1210   K 3.9 01/20/2018 1210   CL 101 01/20/2018 1210   CO2 24 07/06/2017 0645   GLUCOSE 90 01/20/2018 1210   BUN 14 01/20/2018 1210   CREATININE 1.10 01/20/2018 1210   CALCIUM 8.8 (L) 07/06/2017 0645   PROT 7.1 07/06/2017 0645   ALBUMIN 3.7 07/06/2017 0645   AST 17 07/06/2017 0645   ALT 14 (L) 07/06/2017 0645   ALKPHOS 56 07/06/2017 0645   BILITOT 0.6 07/06/2017 0645   GFRNONAA >60 07/06/2017 0645   GFRAA >60 07/06/2017 0645    Lipid Panel     Component Value Date/Time   CHOL 161 11/03/2016 0751   TRIG 41 11/03/2016 0751   HDL 63 11/03/2016 0751   CHOLHDL 2.6 11/03/2016 0751   VLDL 8 11/03/2016 0751   LDLCALC 90 11/03/2016 0751     Imaging I have reviewed the images obtained:  CT-scan of the brain--shows no acute intracranial abnormality  MRI examination of the brain-ordered  CTA head and neck pending formal reading   CTA chest pending formal reading  Felicie Morn PA-C [facility] [phone]  M-F  (9:00 am- 5:00 PM)  01/20/2018, 12:24 PM     Assessment:  57 year old male with bizarre presentation  of difficulty breathing followed by an responsiveness and then subjective left-sided weakness with variable exam. CT scan of the head as well as CT angiogram of the brain showed no significant abnormalities. Neurological exam has nonorganic features of subjective left hemiplegia   Recommendations:Recommend check MRI scan the brain but clinical presentation seems unlikely to suggest a definite stroke. Check CT angiogram of the chest to rule out pulmonary embolism. If above workup is negative and patient's condition continues to improve he may be discharged but will need outpatient psychiatry follow-up to identify underlying stressors and appropriate management of his PTSD. Discuss with ED physician and answered questions. Greater than 50% time during this 60 minute consultation visit was spent on counseling and coordination of  care and discussion with the care team. I have personally examined this patient, reviewed notes, independently viewed imaging studies, participated in medical decision making and plan of care.ROS completed by me personally and pertinent positives fully documented  I have made any additions or clarifications directly to the above note. Agree with note above.    Delia Heady, MD Medical Director [facility] Pager: [phone] 01/20/2018 6:23 PM

## 2018-01-20 NOTE — ED Triage Notes (Signed)
Pt arrived via [facility] from home c/o sudden onset weakness after witnessed syncopal event while pt was in bed. Per EMS via wife, pt was talking and c/o SOB just before syncopal event. Pt presents with left sided weakness. Code stroke called at ED bridge by EDP at time of arrival. Pt is alert but unable to clearly articulate answers to questions. Following commands to the best of abilities at time of arrival. LKW 10:40. Pt cleared for CT at 11:50.  EMS v/s 135/80, hr 65, 98%ra, cbg 86.

## 2018-01-20 NOTE — ED Notes (Addendum)
Family updated on current situation and pt location (CT).

## 2018-03-25 ENCOUNTER — Other Ambulatory Visit: Payer: Self-pay

## 2018-03-25 ENCOUNTER — Emergency Department (HOSPITAL_BASED_OUTPATIENT_CLINIC_OR_DEPARTMENT_OTHER): Payer: No Typology Code available for payment source

## 2018-03-25 ENCOUNTER — Emergency Department (HOSPITAL_BASED_OUTPATIENT_CLINIC_OR_DEPARTMENT_OTHER)
Admission: EM | Admit: 2018-03-25 | Discharge: 2018-03-25 | Disposition: A | Discharge status: Home / Self Care | Payer: No Typology Code available for payment source | Attending: Emergency Medicine | Admitting: Emergency Medicine

## 2018-03-25 ENCOUNTER — Encounter (HOSPITAL_BASED_OUTPATIENT_CLINIC_OR_DEPARTMENT_OTHER): Payer: Self-pay | Admitting: Emergency Medicine

## 2018-03-25 DIAGNOSIS — M542 Cervicalgia: Secondary | ICD-10-CM | POA: Insufficient documentation

## 2018-03-25 DIAGNOSIS — Z9889 Other specified postprocedural states: Secondary | ICD-10-CM | POA: Insufficient documentation

## 2018-03-25 DIAGNOSIS — Z79899 Other long term (current) drug therapy: Secondary | ICD-10-CM | POA: Insufficient documentation

## 2018-03-25 HISTORY — DX: Polyneuropathy, unspecified: G62.9

## 2018-03-25 HISTORY — DX: Unspecified osteoarthritis, unspecified site: M19.90

## 2018-03-25 LAB — COMPREHENSIVE METABOLIC PANEL
ALT: 22 U/L (ref 0–44)
AST: 24 U/L (ref 15–41)
Albumin: 4.5 g/dL (ref 3.5–5.0)
Alkaline Phosphatase: 58 U/L (ref 38–126)
Anion gap: 6 (ref 5–15)
BILIRUBIN TOTAL: 0.7 mg/dL (ref 0.3–1.2)
BUN: 17 mg/dL (ref 6–20)
CO2: 24 mmol/L (ref 22–32)
CREATININE: 0.95 mg/dL (ref 0.61–1.24)
Calcium: 9.1 mg/dL (ref 8.9–10.3)
Chloride: 101 mmol/L (ref 98–111)
GFR calc Af Amer: >60 mL/min (ref >60)
Glucose, Bld: 100 mg/dL — ABNORMAL HIGH (ref 70–99)
Potassium: 3.9 mmol/L (ref 3.5–5.1)
Sodium: 131 mmol/L — ABNORMAL LOW (ref 135–145)
TOTAL PROTEIN: 7.4 g/dL (ref 6.5–8.1)

## 2018-03-25 LAB — CBC WITH DIFFERENTIAL/PLATELET
ABS IMMATURE GRANULOCYTES: 0.02 10*3/uL (ref 0.00–0.07)
Basophils Absolute: 0 10*3/uL (ref 0.0–0.1)
Basophils Relative: 1 %
Eosinophils Absolute: 0.1 10*3/uL (ref 0.0–0.5)
Eosinophils Relative: 1 %
HEMATOCRIT: 39.6 % (ref 39.0–52.0)
HEMOGLOBIN: 13.2 g/dL (ref 13.0–17.0)
Immature Granulocytes: 0 %
Lymphocytes Relative: 13 %
Lymphs Abs: 0.8 10*3/uL (ref 0.7–4.0)
MCH: 34.2 pg — ABNORMAL HIGH (ref 26.0–34.0)
MCHC: 33.3 g/dL (ref 30.0–36.0)
MCV: 102.6 fL — AB (ref 80.0–100.0)
MONOS PCT: 7 %
Monocytes Absolute: 0.4 10*3/uL (ref 0.1–1.0)
NRBC: 0 % (ref 0.0–0.2)
Neutro Abs: 5 10*3/uL (ref 1.7–7.7)
Neutrophils Relative %: 78 %
Platelets: 184 10*3/uL (ref 150–400)
RBC: 3.86 MIL/uL — ABNORMAL LOW (ref 4.22–5.81)
RDW: 12.7 % (ref 11.5–15.5)
WBC: 6.3 10*3/uL (ref 4.0–10.5)

## 2018-03-25 MED ORDER — IOPAMIDOL (ISOVUE-300) INJECTION 61%
100.0000 mL | Freq: Once | INTRAVENOUS | Status: AC | PRN
  Administered 2018-03-25: 75 mL via INTRAVENOUS

## 2018-03-25 MED ORDER — OXYCODONE-ACETAMINOPHEN 5-325 MG PO TABS
1.0000 | ORAL_TABLET | Freq: Once | ORAL | Status: AC
  Administered 2018-03-25: 1 via ORAL
  Filled 2018-03-25: qty 1

## 2018-03-25 MED ORDER — SODIUM CHLORIDE 0.9 % IV BOLUS
1000.0000 mL | Freq: Once | INTRAVENOUS | Status: AC
  Administered 2018-03-25: 1000 mL via INTRAVENOUS

## 2018-03-25 NOTE — ED Provider Notes (Signed)
[facility] EMERGENCY DEPARTMENT Provider Note   CSN: [id number] Arrival date & time: 03/25/18  1108     History   Chief Complaint Chief Complaint  Patient presents with  . Post-op Problem    HPI Erik Olson is a 58 y.o. male.  HPI  58 year old male with a history of cervical stenosis with recent discectomy, osteophytectomy arthrodesis, cervical anterior plating who presents with concern for neck swelling.  Reports pain with swallowing. No drainage.  No fevers, nausea or vomiting. Past Medical History:  Diagnosis Date  . Anemia   . Anxiety   . Arthritis   . Bradycardia   . Chest pain   . Depression   . Narrowing of intervertebral disc space    C6-7  . Neuropathy   . Numbness and tingling in right hand   . PTSD (post-traumatic stress disorder)     Patient Active Problem List   Diagnosis Date Noted  . Syncope 07/05/2017  . Narrowing of intervertebral disc space 11/04/2016  . Anemia 11/03/2016  . Chest pain 11/03/2016  . Numbness and tingling in right hand 11/03/2016  . Bradycardia 11/03/2016  . PTSD (post-traumatic stress disorder)   . Normochromic normocytic anemia   . Gastroenteritis 09/03/2010  . ACUTE PHARYNGITIS 05/29/2009  . FATIGUE 05/29/2009  . DIZZINESS 01/30/2009  . CHEST PAIN UNSPECIFIED 01/30/2009  . Alcohol abuse 06/29/2008  . SYNCOPE 06/29/2008  . Anxiety state 01/26/2007  . DEPRESSION 12/31/2006    Past Surgical History:  Procedure Laterality Date  . COLONOSCOPY    . SPINE SURGERY     with hardware   . WISDOM TOOTH EXTRACTION          Home Medications    Prior to Admission medications   Medication Sig Start Date End Date Taking? Authorizing Provider  ALPRAZolam Prudy Feeler) 0.5 MG tablet Take 0.5 mg by mouth every 4 (four) hours.     Yes [provider]  DULoxetine (CYMBALTA) 30 MG capsule Take 30 mg by mouth 2 (two) times daily.   Yes [provider]  gabapentin (NEURONTIN) 400 MG capsule Take 400 mg by  mouth 4 (four) times daily.   Yes [provider]  HYDROcodone-acetaminophen (NORCO/VICODIN) 5-325 MG tablet Take 1 tablet by mouth every 6 (six) hours as needed for moderate pain.   Yes [provider]  esomeprazole (NEXIUM) 40 MG capsule Take 1 capsule (40 mg total) by mouth daily. 12/08/16   Caccavale, Sophia, PA-C  gabapentin (NEURONTIN) 300 MG capsule Take 300 mg by mouth 4 (four) times daily.      [provider]  ranitidine (ZANTAC) 150 MG tablet Take 150 mg by mouth daily.    [provider]    Family History Family History  Problem Relation Age of Onset  . Breast cancer Mother   . Suicidality Brother     Social History Social History   Tobacco Use  . Smoking status: Never Smoker  . Smokeless tobacco: Current User    Types: Snuff  Substance Use Topics  . Alcohol use: No  . Drug use: No     Allergies   Penicillins   Review of Systems Review of Systems   Physical Exam Updated Vital Signs BP 127/80 (BP Location: Left Arm)   Pulse (!) 52   Temp 98 F (36.7 C) (Oral)   Resp 16   SpO2 100%   Physical Exam Vitals signs and nursing note reviewed.  Constitutional:      General: He is  not in acute distress.    Appearance: He is well-developed. He is not diaphoretic.  HENT:     Head: Normocephalic and atraumatic.  Eyes:     Conjunctiva/sclera: Conjunctivae normal.  Neck:     Musculoskeletal: Normal range of motion. No neck rigidity.  Cardiovascular:     Rate and Rhythm: Normal rate and regular rhythm.     Heart sounds: Normal heart sounds. No murmur. No friction rub. No gallop.   Pulmonary:     Effort: Pulmonary effort is normal. No respiratory distress.     Breath sounds: Normal breath sounds. No wheezing or rales.  Abdominal:     General: There is no distension.     Palpations: Abdomen is soft.     Tenderness: There is no abdominal tenderness. There is no guarding.  Skin:    General: Skin is warm and dry.    Neurological:     Mental Status: He is alert and oriented to person, place, and time.      ED Treatments / Results  Labs (all labs ordered are listed, but only abnormal results are displayed) Labs Reviewed  CBC WITH DIFFERENTIAL/PLATELET - Abnormal; Notable for the following components:      Result Value   RBC 3.86 (*)    MCV 102.6 (*)    MCH 34.2 (*)    All other components within normal limits  COMPREHENSIVE METABOLIC PANEL - Abnormal; Notable for the following components:   Sodium 131 (*)    Glucose, Bld 100 (*)    All other components within normal limits    EKG None  Radiology Ct Soft Tissue Neck W Contrast  Result Date: 03/25/2018 CLINICAL DATA:  Anterior neck swelling. Painful swallowing. Cervical spine fusion in 01/2018. EXAM: CT NECK WITH CONTRAST TECHNIQUE: Multidetector CT imaging of the neck was performed using the standard protocol following the bolus administration of intravenous contrast. CONTRAST:  75mL ISOVUE-300 IOPAMIDOL (ISOVUE-300) INJECTION 61% COMPARISON:  Head and neck CTA 01/20/2018 FINDINGS: Pharynx and larynx: Chronic asymmetric effacement of the right vallecula, slightly more prominent than on the prior study. Mild asymmetric prominence of the right palatine tonsillar soft tissues as well. No discrete enhancing mass identified with assessment mildly limited by motion. Widely patent airway. No retropharyngeal fluid collection. Salivary glands: No inflammation, mass, or stone. Thyroid: Unremarkable. Lymph nodes: No enlarged or suspicious lymph nodes in the neck. Vascular: Congenitally hypoplastic right vertebral artery. Limited intracranial: Unremarkable. Visualized orbits: Unremarkable. Mastoids and visualized paranasal sinuses: Minimal mucosal thickening in the maxillary sinuses. Clear mastoid air cells. Skeleton: Interval C5-C7 ACDF. No prevertebral fluid collection identified within limitations of metallic streak artifact. Upper chest: Clear lung apices.  Other: New skin thickening anteriorly in the neck on the left with stranding in the subjacent fat. No fluid collection. IMPRESSION: 1. Interval C5-C7 ACDF. Left anterior neck skin thickening and fat stranding which may be postoperative or infectious. No abscess. 2. Chronic mild asymmetry of the right tongue base and right palatine tonsil. Correlate with direct visualization. Electronically Signed   By: Sebastian AcheAllen  Grady M.D.   On: 03/25/2018 13:36    Procedures Procedures (including critical care time)  Medications Ordered in ED Medications  sodium chloride 0.9 % bolus 1,000 mL (0 mLs Intravenous Stopped 03/25/18 1433)  oxyCODONE-acetaminophen (PERCOCET/ROXICET) 5-325 MG per tablet 1 tablet (1 tablet Oral Given 03/25/18 1247)  iopamidol (ISOVUE-300) 61 % injection 100 mL (75 mLs Intravenous Contrast Given 03/25/18 1306)     Initial Impression / Assessment and Plan /  ED Course  I have reviewed the triage vital signs and the nursing notes.  Pertinent labs & imaging results that were available during my care of the patient were reviewed by me and considered in my medical decision making (see chart for details).    58 year old male with a history of cervical stenosis with recent discectomy, osteophytectomy arthrodesis, cervical anterior plating who presents with concern for neck swelling.  CT soft tissue neck shows no evidence of abscess.  Neck incision has no surrounding erythema, no drainage, and no fevers, no leukocytosis. Discussed with Dr. Jiles Prows who performed his surgery.  No sign of acute infection. Recommends patient follow up as scheduled in the office.    Final Clinical Impressions(s) / ED Diagnoses   Final diagnoses:  Neck pain  Post-operative state    ED Discharge Orders    None       Alvira Monday, MD 03/27/18 0021

## 2018-03-25 NOTE — ED Triage Notes (Addendum)
Had surgery around Thanksgiving on C4-C6, and last night noticed swelling to incision site to neck and painful to swallow, no redness noted . His MD is out of town

## 2018-05-19 ENCOUNTER — Encounter (HOSPITAL_BASED_OUTPATIENT_CLINIC_OR_DEPARTMENT_OTHER): Payer: Self-pay | Admitting: Emergency Medicine

## 2018-05-19 ENCOUNTER — Emergency Department (HOSPITAL_BASED_OUTPATIENT_CLINIC_OR_DEPARTMENT_OTHER)
Admission: EM | Admit: 2018-05-19 | Discharge: 2018-05-19 | Disposition: A | Discharge status: Home / Self Care | Payer: No Typology Code available for payment source | Attending: Emergency Medicine | Admitting: Emergency Medicine

## 2018-05-19 ENCOUNTER — Other Ambulatory Visit: Payer: Self-pay

## 2018-05-19 DIAGNOSIS — M79606 Pain in leg, unspecified: Secondary | ICD-10-CM | POA: Diagnosis not present

## 2018-05-19 DIAGNOSIS — G6289 Other specified polyneuropathies: Secondary | ICD-10-CM | POA: Diagnosis not present

## 2018-05-19 DIAGNOSIS — Z79899 Other long term (current) drug therapy: Secondary | ICD-10-CM | POA: Diagnosis not present

## 2018-05-19 DIAGNOSIS — M79603 Pain in arm, unspecified: Secondary | ICD-10-CM | POA: Diagnosis present

## 2018-05-19 DIAGNOSIS — F17228 Nicotine dependence, chewing tobacco, with other nicotine-induced disorders: Secondary | ICD-10-CM | POA: Insufficient documentation

## 2018-05-19 LAB — CBC WITH DIFFERENTIAL/PLATELET
ABS IMMATURE GRANULOCYTES: 0.01 10*3/uL (ref 0.00–0.07)
BASOS PCT: 1 %
Basophils Absolute: 0 10*3/uL (ref 0.0–0.1)
Eosinophils Absolute: 0.1 10*3/uL (ref 0.0–0.5)
Eosinophils Relative: 2 %
HCT: 44.5 % (ref 39.0–52.0)
Hemoglobin: 14.5 g/dL (ref 13.0–17.0)
Immature Granulocytes: 0 %
Lymphocytes Relative: 35 %
Lymphs Abs: 1.9 10*3/uL (ref 0.7–4.0)
MCH: 33.3 pg (ref 26.0–34.0)
MCHC: 32.6 g/dL (ref 30.0–36.0)
MCV: 102.3 fL — ABNORMAL HIGH (ref 80.0–100.0)
MONO ABS: 0.4 10*3/uL (ref 0.1–1.0)
Monocytes Relative: 7 %
NEUTROS ABS: 3 10*3/uL (ref 1.7–7.7)
NEUTROS PCT: 55 %
Platelets: 234 10*3/uL (ref 150–400)
RBC: 4.35 MIL/uL (ref 4.22–5.81)
RDW: 12.6 % (ref 11.5–15.5)
WBC: 5.5 10*3/uL (ref 4.0–10.5)
nRBC: 0 % (ref 0.0–0.2)

## 2018-05-19 LAB — BASIC METABOLIC PANEL
ANION GAP: 8 (ref 5–15)
BUN: 16 mg/dL (ref 6–20)
CO2: 26 mmol/L (ref 22–32)
Calcium: 9.6 mg/dL (ref 8.9–10.3)
Chloride: 103 mmol/L (ref 98–111)
Creatinine, Ser: 1.03 mg/dL (ref 0.61–1.24)
Glucose, Bld: 91 mg/dL (ref 70–99)
POTASSIUM: 4.5 mmol/L (ref 3.5–5.1)
SODIUM: 137 mmol/L (ref 135–145)

## 2018-05-19 MED ORDER — HYDROCODONE-ACETAMINOPHEN 5-325 MG PO TABS: 1.0000 | ORAL_TABLET | Freq: Four times a day (QID) | ORAL | 0 refills | Status: DC | PRN

## 2018-05-19 MED ORDER — MORPHINE SULFATE (PF) 4 MG/ML IV SOLN
4.0000 mg | Freq: Once | INTRAVENOUS | Status: AC
  Administered 2018-05-19: 4 mg via INTRAVENOUS
  Filled 2018-05-19: qty 1

## 2018-05-19 MED ORDER — KETOROLAC TROMETHAMINE 30 MG/ML IJ SOLN
30.0000 mg | Freq: Once | INTRAMUSCULAR | Status: AC
  Administered 2018-05-19: 30 mg via INTRAVENOUS
  Filled 2018-05-19: qty 1

## 2018-05-19 NOTE — ED Notes (Signed)
Family states he is going to pass out soon if he doesn't get a room

## 2018-05-19 NOTE — ED Notes (Addendum)
Called out to assess patient.  Pt sitting in chair, not alert.  Family holding patient in wheelchair, states they are worried that he is going to pass out of the chair.  Obtained ammonia capsule,  Pt is holding his breath.   When pt did take a breath, he suddenly awoke, clinched both arms in attack position as if he was going to punch someone in the face.  Both upper arms clinched, muscles taught and pt able to move in chair without hesitation.  Family talked down the patient.  Will attempt to transfer pt to a room asap.

## 2018-05-19 NOTE — ED Notes (Signed)
Before I gave the pain meds, patient is fully awake, texting on the phone and conversing with wife.

## 2018-05-19 NOTE — ED Triage Notes (Signed)
Per EMS:  Pt is having increased pain from neuropathy. Pt wanting pain medication.

## 2018-05-19 NOTE — ED Notes (Signed)
Called Dr. Puwawant/Neurologist as requested by Dr. Judd Lien.  Requested a call back to Dr. Judd Lien as soon as possible.

## 2018-05-19 NOTE — Discharge Instructions (Addendum)
Hydrocodone as prescribed as needed for pain.  Follow-up with your neurologist and primary doctor in the near future.

## 2018-05-19 NOTE — ED Notes (Signed)
Pt refuse BP in triage , informed BP is needed for MD to eval pt.

## 2018-05-19 NOTE — ED Notes (Signed)
ED Provider at bedside.[phone]

## 2018-05-19 NOTE — ED Provider Notes (Signed)
[facility] EMERGENCY DEPARTMENT Provider Note   CSN: [id number] Arrival date & time: 05/19/18  1332    History   Chief Complaint Chief Complaint  Patient presents with  . Pain    HPI Erik Olson is a 58 y.o. male.     Patient is a 58 year old male with past medical history of peripheral neuropathy with chronic pain related to some sort of chemical exposure while he was in the [employer].  He also has a history of PTSD.  He is followed by a neurologist at [facility] as well as a pain management specialist here in [place].  He was told to come here today for evaluation of a worsening of his arm and leg pain.  Patient states that the pain has become so excruciating that he is on the verge of "losing consciousness".  He had some sort of hyperventilation episode in the waiting room, then was brought to the exam room.  Patient is due to have a wide array of blood testing performed and is due to have blood drawn at the [facility].  His family called his neurologist this afternoon and he was told to come to the ER to be evaluated.  The history is provided by the patient.    Past Medical History:  Diagnosis Date  . Anemia   . Anxiety   . Arthritis   . Bradycardia   . Chest pain   . Depression   . Narrowing of intervertebral disc space    C6-7  . Neuropathy   . Numbness and tingling in right hand   . PTSD (post-traumatic stress disorder)     Patient Active Problem List   Diagnosis Date Noted  . Syncope 07/05/2017  . Narrowing of intervertebral disc space 11/04/2016  . Anemia 11/03/2016  . Chest pain 11/03/2016  . Numbness and tingling in right hand 11/03/2016  . Bradycardia 11/03/2016  . PTSD (post-traumatic stress disorder)   . Normochromic normocytic anemia   . Gastroenteritis 09/03/2010  . ACUTE PHARYNGITIS 05/29/2009  . FATIGUE 05/29/2009  . DIZZINESS 01/30/2009  . CHEST PAIN UNSPECIFIED 01/30/2009  . Alcohol abuse 06/29/2008  . SYNCOPE 06/29/2008   . Anxiety state 01/26/2007  . DEPRESSION 12/31/2006    Past Surgical History:  Procedure Laterality Date  . COLONOSCOPY    . SPINE SURGERY     with hardware   . WISDOM TOOTH EXTRACTION          Home Medications    Prior to Admission medications   Medication Sig Start Date End Date Taking? Authorizing Provider  acetaminophen (TYLENOL) 325 MG tablet Take by mouth. 02/20/18  Yes [provider]  Cholecalciferol (VITAMIN D-1000 MAX ST) 25 MCG (1000 UT) tablet Take by mouth. 02/20/18 05/21/18 Yes [provider]  pregabalin (LYRICA) 50 MG capsule Take by mouth. 05/11/18  Yes [provider]  ALPRAZolam Prudy Feeler) 0.5 MG tablet Take 0.5 mg by mouth every 4 (four) hours.      [provider]  DULoxetine (CYMBALTA) 30 MG capsule Take 30 mg by mouth 2 (two) times daily.    [provider]  esomeprazole (NEXIUM) 40 MG capsule Take 1 capsule (40 mg total) by mouth daily. 12/08/16   Caccavale, Sophia, PA-C  ferrous sulfate 325 (65 FE) MG tablet Take by mouth.    [provider]  gabapentin (NEURONTIN) 300 MG capsule Take 300 mg by mouth 4 (four) times daily.      [provider]  gabapentin (  NEURONTIN) 400 MG capsule Take 400 mg by mouth 4 (four) times daily.    [provider]  HYDROcodone-acetaminophen (NORCO/VICODIN) 5-325 MG tablet Take 1 tablet by mouth every 6 (six) hours as needed for moderate pain.    [provider]  ranitidine (ZANTAC) 150 MG tablet Take 150 mg by mouth daily.    [provider]    Family History Family History  Problem Relation Age of Onset  . Breast cancer Mother   . Suicidality Brother     Social History Social History   Tobacco Use  . Smoking status: Never Smoker  . Smokeless tobacco: Current User    Types: Snuff  Substance Use Topics  . Alcohol use: No  . Drug use: No     Allergies   Penicillins   Review of Systems Review of Systems  All other systems  reviewed and are negative.    Physical Exam Updated Vital Signs BP (!) 114/99 (BP Location: Right Arm)   Pulse 65   Temp 98.1 F (36.7 C) (Oral)   Resp 16   Ht 6\' 1"  (1.854 m)   Wt 88 kg   SpO2 99%   BMI 25.59 kg/m   Physical Exam Vitals signs and nursing note reviewed.  Constitutional:      General: He is not in acute distress.    Appearance: He is well-developed. He is not diaphoretic.  HENT:     Head: Normocephalic and atraumatic.  Neck:     Musculoskeletal: Normal range of motion and neck supple.  Cardiovascular:     Rate and Rhythm: Normal rate and regular rhythm.     Heart sounds: No murmur. No friction rub.  Pulmonary:     Effort: Pulmonary effort is normal. No respiratory distress.     Breath sounds: Normal breath sounds. No wheezing or rales.  Abdominal:     General: Bowel sounds are normal. There is no distension.     Palpations: Abdomen is soft.     Tenderness: There is no abdominal tenderness.  Musculoskeletal: Normal range of motion.  Skin:    General: Skin is warm and dry.  Neurological:     General: No focal deficit present.     Mental Status: He is alert and oriented to person, place, and time.     Sensory: Sensory deficit present.     Motor: No weakness.     Coordination: Coordination normal.     Comments: Drink is 5 out of 5 in both upper and lower extremities.  Sensation is diminished to the hands and feet in a stocking and glove distribution.      ED Treatments / Results  Labs (all labs ordered are listed, but only abnormal results are displayed) Labs Reviewed - No data to display  EKG None  Radiology No results found.  Procedures Procedures (including critical care time)  Medications Ordered in ED Medications - No data to display   Initial Impression / Assessment and Plan / ED Course  I have reviewed the triage vital signs and the nursing notes.  Pertinent labs & imaging results that were available during my care of the  patient were reviewed by me and considered in my medical decision making (see chart for details).  Patient presenting with complaints of worsening pain in his arms and legs related to neuropathy he says he obtained while in the [employer].  He states that this is related to some form of chemical agent exposure.  He was seen by  a neurologist on Friday from [facility].  I spoke with this physician, Dr. Anne Hahn regarding the patient's care.  She makes no specific recommendations.  Patient states that he is almost lost consciousness on several occasions while in the waiting room and in his exam room.  I witnessed 1 of these episodes and he appears in no distress.  Vitals are stable.  He has been observed on the monitor for several hours and his rhythm has been sinus with no ectopy.  I see nothing that appears emergent and believe the patient is appropriate for discharge.  Patient's pain was treated and will be discharged with a small quantity of hydrocodone.  He sees pain management, however is not taking chronic opioids.  His prescription filling habits were reviewed on the narcotic database.  Final Clinical Impressions(s) / ED Diagnoses   Final diagnoses:  None    ED Discharge Orders    None       Geoffery Lyons, MD 05/19/18 1740

## 2018-05-19 NOTE — ED Notes (Signed)
Patient stated that he was exposed on a chemical while he was in the [employer] and both his arms from elbow to his hands feels burning and sharp and both his foot feels like burning, sharp and pin-needles.  It gets worst all the time.

## 2018-09-09 ENCOUNTER — Emergency Department (HOSPITAL_COMMUNITY)
Admission: EM | Admit: 2018-09-09 | Discharge: 2018-09-09 | Disposition: A | Discharge status: Home / Self Care | Payer: No Typology Code available for payment source | Attending: Emergency Medicine | Admitting: Emergency Medicine

## 2018-09-09 ENCOUNTER — Other Ambulatory Visit: Payer: Self-pay

## 2018-09-09 ENCOUNTER — Encounter (HOSPITAL_COMMUNITY): Payer: Self-pay | Admitting: Emergency Medicine

## 2018-09-09 DIAGNOSIS — F17220 Nicotine dependence, chewing tobacco, uncomplicated: Secondary | ICD-10-CM | POA: Insufficient documentation

## 2018-09-09 DIAGNOSIS — G629 Polyneuropathy, unspecified: Secondary | ICD-10-CM | POA: Diagnosis present

## 2018-09-09 DIAGNOSIS — Z79899 Other long term (current) drug therapy: Secondary | ICD-10-CM | POA: Insufficient documentation

## 2018-09-09 LAB — BASIC METABOLIC PANEL
Anion gap: 8 (ref 5–15)
BUN: 13 mg/dL (ref 6–20)
CO2: 24 mmol/L (ref 22–32)
Calcium: 9.1 mg/dL (ref 8.9–10.3)
Chloride: 102 mmol/L (ref 98–111)
Creatinine, Ser: 1.13 mg/dL (ref 0.61–1.24)
GFR calc Af Amer: >60 mL/min (ref >60)
GFR calc non Af Amer: >60 mL/min (ref >60)
Glucose, Bld: 90 mg/dL (ref 70–99)
Potassium: 4.4 mmol/L (ref 3.5–5.1)
Sodium: 134 mmol/L — ABNORMAL LOW (ref 135–145)

## 2018-09-09 LAB — CBC WITH DIFFERENTIAL/PLATELET
Abs Immature Granulocytes: 0.01 10*3/uL (ref 0.00–0.07)
Basophils Absolute: 0 10*3/uL (ref 0.0–0.1)
Basophils Relative: 1 %
Eosinophils Absolute: 0.2 10*3/uL (ref 0.0–0.5)
Eosinophils Relative: 4 %
HCT: 40.6 % (ref 39.0–52.0)
Hemoglobin: 13.7 g/dL (ref 13.0–17.0)
Immature Granulocytes: 0 %
Lymphocytes Relative: 28 %
Lymphs Abs: 1.4 10*3/uL (ref 0.7–4.0)
MCH: 34.3 pg — ABNORMAL HIGH (ref 26.0–34.0)
MCHC: 33.7 g/dL (ref 30.0–36.0)
MCV: 101.8 fL — ABNORMAL HIGH (ref 80.0–100.0)
Monocytes Absolute: 0.3 10*3/uL (ref 0.1–1.0)
Monocytes Relative: 6 %
Neutro Abs: 3.2 10*3/uL (ref 1.7–7.7)
Neutrophils Relative %: 61 %
Platelets: 211 10*3/uL (ref 150–400)
RBC: 3.99 MIL/uL — ABNORMAL LOW (ref 4.22–5.81)
RDW: 12.4 % (ref 11.5–15.5)
WBC: 5.2 10*3/uL (ref 4.0–10.5)
nRBC: 0 % (ref 0.0–0.2)

## 2018-09-09 MED ORDER — OXYCODONE-ACETAMINOPHEN 5-325 MG PO TABS: 1.0000 | ORAL_TABLET | Freq: Four times a day (QID) | ORAL | 0 refills | Status: DC | PRN

## 2018-09-09 MED ORDER — FENTANYL CITRATE (PF) 100 MCG/2ML IJ SOLN
50.0000 ug | Freq: Once | INTRAMUSCULAR | Status: AC
  Administered 2018-09-09: 17:00:00 50 ug via INTRAVENOUS
  Filled 2018-09-09: qty 2

## 2018-09-09 MED ORDER — FENTANYL CITRATE (PF) 100 MCG/2ML IJ SOLN
50.0000 ug | Freq: Once | INTRAMUSCULAR | Status: AC
  Administered 2018-09-09: 18:00:00 50 ug via INTRAVENOUS
  Filled 2018-09-09: qty 2

## 2018-09-09 NOTE — ED Triage Notes (Addendum)
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 States that over the weekend he started to experience worsening pain and Tuesday the pain got bad and it hasn't stopped. Pt received 260mcg fentanyl via EMS. Pt states his pain is normally at 8/10 at all times.

## 2018-09-09 NOTE — ED Provider Notes (Signed)
[facility] EMERGENCY DEPARTMENT Provider Note   CSN: [id number] Arrival date & time: 09/09/18  1517    History   Chief Complaint Chief Complaint  Patient presents with  . Peripheral Neuropathy    HPI Erik Olson is a 58 y.o. male.     HPI   58 year old male presents today with complaints of body pain.  Patient notes that he was in the [employer] and is exposed to a neurotoxin.  He notes that he has had several years of intermittent spikes in pain.  He has been seen several times for this and is currently followed at [facility].  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  He has been trying to get into pain management but secondary to COVID was unable to be seen by pain management.  He currently takes gabapentin and benzodiazepines for PTSD.  He notes that he has spikes in pain that are very severe, has daily pain but is able to manage this.  Patient notes over the last several days he has had worsening nerve pain in his muscles throughout his entire body.  He notes that when this happens he tends to pass out.  He notes he did pass out today.  He called his neurologist recommended to come to the emergency room for pain management.  Denies any infectious etiology.  His symptoms are improved after fentanyl via EMS.  He denies any preceding chest pain shortness of breath.    Past Medical History:  Diagnosis Date  . Anemia   . Anxiety   . Arthritis   . Bradycardia   . Chest pain   . Depression   . Narrowing of intervertebral disc space    C6-7  . Neuropathy   . Numbness and tingling in right hand   . PTSD (post-traumatic stress disorder)     Patient Active Problem List   Diagnosis Date Noted  . Syncope 07/05/2017  . Narrowing of intervertebral disc space 11/04/2016  . Anemia 11/03/2016  . Chest pain 11/03/2016  . Numbness and tingling in right hand 11/03/2016  . Bradycardia 11/03/2016  . PTSD (post-traumatic stress disorder)    . Normochromic normocytic anemia   . Gastroenteritis 09/03/2010  . ACUTE PHARYNGITIS 05/29/2009  . FATIGUE 05/29/2009  . DIZZINESS 01/30/2009  . CHEST PAIN UNSPECIFIED 01/30/2009  . Alcohol abuse 06/29/2008  . SYNCOPE 06/29/2008  . Anxiety state 01/26/2007  . DEPRESSION 12/31/2006    Past Surgical History:  Procedure Laterality Date  . COLONOSCOPY    . SPINE SURGERY     with hardware   . WISDOM TOOTH EXTRACTION        Home Medications    Prior to Admission medications   Medication Sig Start Date End Date Taking? Authorizing Provider  acetaminophen (TYLENOL) 325 MG tablet Take by mouth. 02/20/18   [provider]  ALPRAZolam Duanne Moron) 0.5 MG tablet Take 0.5 mg by mouth every 4 (four) hours.      [provider]  DULoxetine (CYMBALTA) 30 MG capsule Take 30 mg by mouth 2 (two) times daily.    [provider]  esomeprazole (NEXIUM) 40 MG capsule Take 1 capsule (40 mg total) by mouth daily. 12/08/16   Caccavale, Sophia, PA-C  ferrous sulfate 325 (65 FE) MG tablet Take by mouth.    [provider]  gabapentin (NEURONTIN) 300 MG capsule Take 300 mg by mouth 4 (four) times daily.      [provider]  gabapentin (NEURONTIN) 400 MG capsule Take 400 mg by mouth 4 (four) times daily.    [provider]  HYDROcodone-acetaminophen (NORCO) 5-325 MG tablet Take 1-2 tablets by mouth every 6 (six) hours as needed. 05/19/18   Geoffery Lyonselo, Douglas, MD  oxyCODONE-acetaminophen (PERCOCET/ROXICET) 5-325 MG tablet Take 1 tablet by mouth every 6 (six) hours as needed. 09/09/18   Avraham Benish, Tinnie GensJeffrey, PA-C  pregabalin (LYRICA) 50 MG capsule Take by mouth. 05/11/18   [provider]  ranitidine (ZANTAC) 150 MG tablet Take 150 mg by mouth daily.    [provider]    Family History Family History  Problem Relation Age of Onset  . Breast cancer Mother   . Suicidality Brother     Social History Social History   Tobacco Use  . Smoking status:  Never Smoker  . Smokeless tobacco: Current User    Types: Snuff  Substance Use Topics  . Alcohol use: No  . Drug use: No     Allergies   Penicillins   Review of Systems Review of Systems  All other systems reviewed and are negative.  Physical Exam Updated Vital Signs BP 126/80 (BP Location: Right Arm)   Pulse (!) 58   Temp 98.2 F (36.8 C) (Oral)   Resp 15   SpO2 98%   Physical Exam Vitals signs and nursing note reviewed.  Constitutional:      Appearance: He is well-developed.  HENT:     Head: Normocephalic and atraumatic.  Eyes:     General: No scleral icterus.       Right eye: No discharge.        Left eye: No discharge.     Conjunctiva/sclera: Conjunctivae normal.     Pupils: Pupils are equal, round, and reactive to light.  Neck:     Musculoskeletal: Normal range of motion.     Vascular: No JVD.     Trachea: No tracheal deviation.  Cardiovascular:     Rate and Rhythm: Normal rate and regular rhythm.     Pulses: Normal pulses.     Heart sounds: Normal heart sounds.  Pulmonary:     Effort: Pulmonary effort is normal.     Breath sounds: No stridor.  Musculoskeletal:     Comments: Joint compartments are soft throughout, no decreased sensation through his dermatome  Neurological:     General: No focal deficit present.     Mental Status: He is alert and oriented to person, place, and time.     Coordination: Coordination normal.  Psychiatric:        Behavior: Behavior normal.        Thought Content: Thought content normal.        Judgment: Judgment normal.      ED Treatments / Results  Labs (all labs ordered are listed, but only abnormal results are displayed) Labs Reviewed  CBC WITH DIFFERENTIAL/PLATELET - Abnormal; Notable for the following components:      Result Value   RBC 3.99 (*)    MCV 101.8 (*)    MCH 34.3 (*)    All other components within normal limits  BASIC METABOLIC PANEL - Abnormal; Notable for the following components:   Sodium 134  (*)    All other components within normal limits    EKG None  Radiology No results found.  Procedures Procedures (including critical care time)  Medications Ordered in ED Medications  fentaNYL (SUBLIMAZE) injection 50 mcg (50 mcg Intravenous Given 09/09/18 1657)  fentaNYL (SUBLIMAZE) injection 50  mcg (50 mcg Intravenous Given 09/09/18 1818)     Initial Impression / Assessment and Plan / ED Course  I have reviewed the triage vital signs and the nursing notes.  Pertinent labs & imaging results that were available during my care of the patient were reviewed by me and considered in my medical decision making (see chart for details).        Labs: cBC, BMP  Imaging:  Consults:  Therapeutics: Fentanyl  Discharge Meds: Percocet  Assessment/Plan: 58 year old male presents today with chronic pain.  He has acute episodes of this pain.  He is followed closely by neurology.  He did have a syncopal episode this is typical of previous when he has severe pain.  He had no preceding concerning signs or symptoms is well-appearing now in no acute distress.  Patient's pain managed well with fentanyl.  I do find it reasonable to send him home with a short course of Percocet.  He will continue his plans for outpatient follow-up with neurology and pain management.  He is given strict return precautions, he verbalized understanding and agreement to today's plan had no further questions or concerns.  I also spoke with his wife on the phone who agreed to today's plan.   Final Clinical Impressions(s) / ED Diagnoses   Final diagnoses:  Neuropathy    ED Discharge Orders         Ordered    oxyCODONE-acetaminophen (PERCOCET/ROXICET) 5-325 MG tablet  Every 6 hours PRN     09/09/18 1832           Eyvonne MechanicHedges, Keana Dueitt, PA-C 09/09/18 1834    Jacalyn LefevreHaviland, Julie, MD 09/09/18 2313

## 2018-09-09 NOTE — Discharge Instructions (Addendum)
Please read attached information. If you experience any new or worsening signs or symptoms please return to the emergency room for evaluation. Please follow-up with your primary care provider or specialist as discussed. Please use medication prescribed only as directed and discontinue taking if you have any concerning signs or symptoms.   °

## 2018-09-09 NOTE — ED Notes (Signed)
Patient verbalizes understanding of discharge instructions . Opportunity for questions and answers were provided . Armband removed by staff ,Pt discharged from ED. W/C  offered at D/C  and Declined W/C at D/C and was escorted to lobby by RN.  

## 2019-06-10 IMAGING — CT CT ANGIO CHEST
2 of 7 series · 18 of 46 positions shown · IV contrast (APPLIED)
Comparison: Chest radiograph July 05, 2017

CLINICAL DATA: Shortness of breath

EXAM:
CT ANGIOGRAPHY CHEST WITH CONTRAST
TECHNIQUE: Multidetector CT imaging of the chest was performed using the
standard protocol during bolus administration of intravenous
contrast. Multiplanar CT image reconstructions and MIPs were
obtained to evaluate the vascular anatomy.
CONTRAST:  50mL QKUY4T-1FP IOPAMIDOL (QKUY4T-1FP) INJECTION 76%

[Series 7: thins · axial · 0.74mm/px · z∈[-610,-322]mm · 15 of 464 slices shown]
[im 26/464  lung]
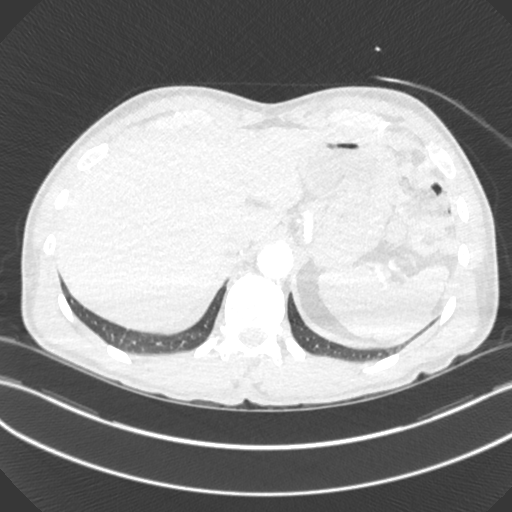
[im 52/464  soft-tissue]
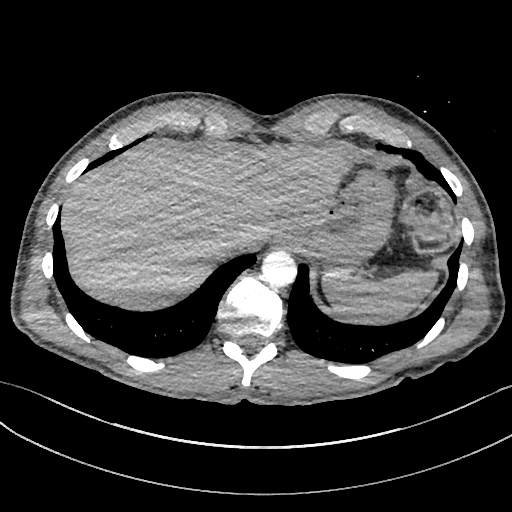
[im 78/464  lung]
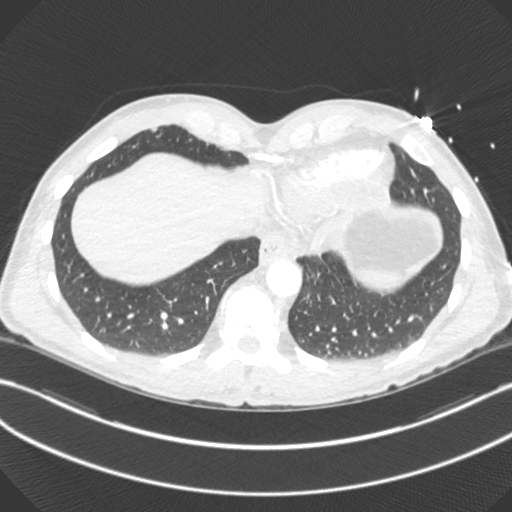
[im 103/464  soft-tissue]
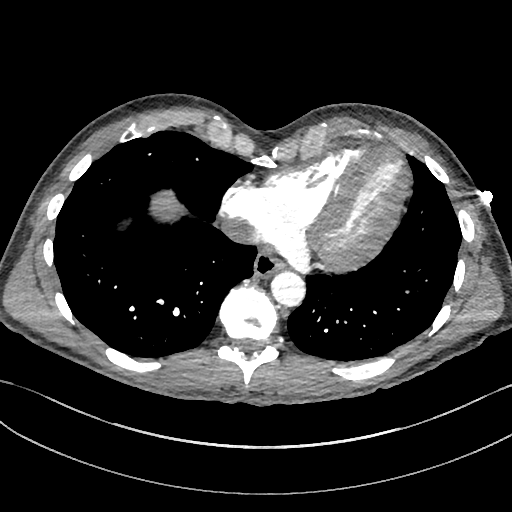
[im 155/464  lung]
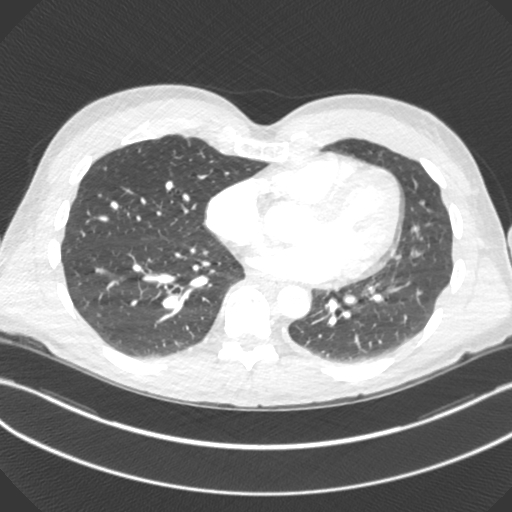
[im 181/464  soft-tissue]
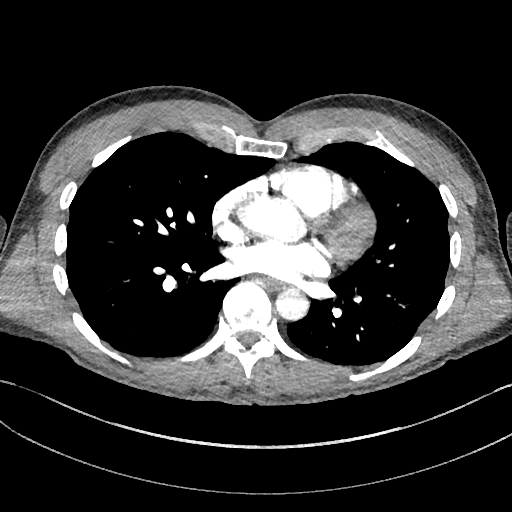
[im 206/464  lung]
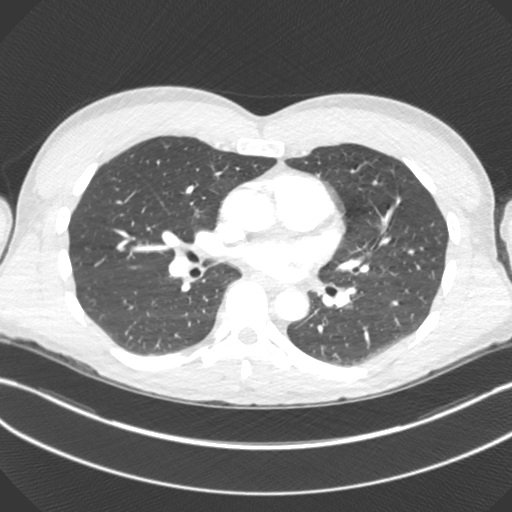
[im 232/464  soft-tissue]
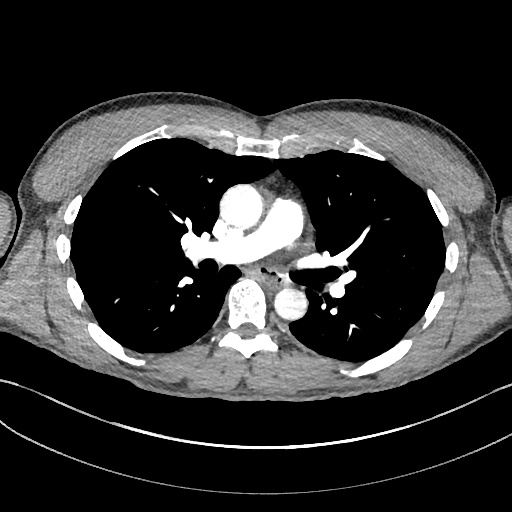
[im 258/464  lung]
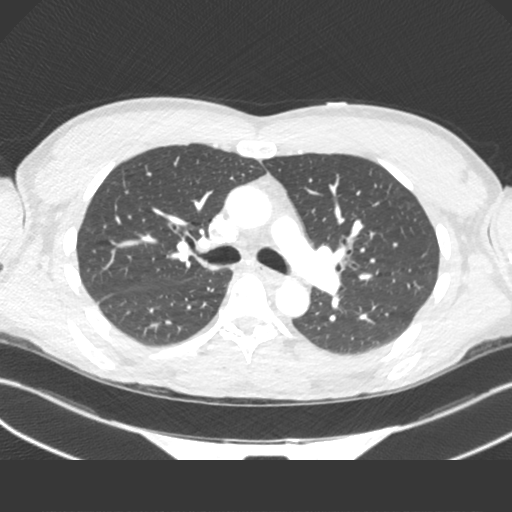
[im 283/464  soft-tissue]
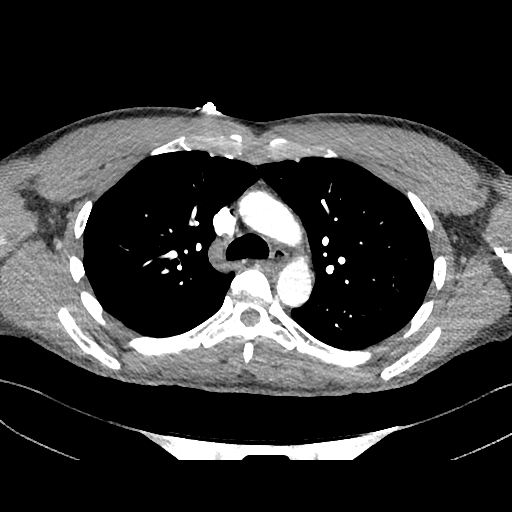
[im 309/464  lung]
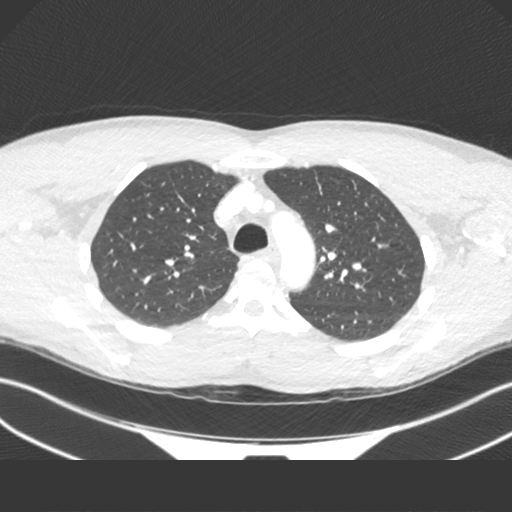
[im 361/464  soft-tissue]
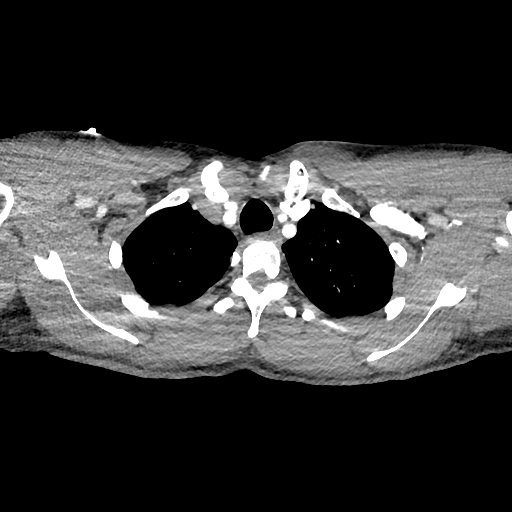
[im 386/464  lung]
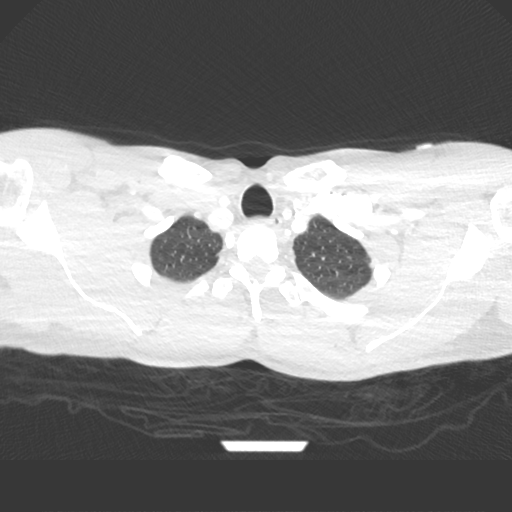
[im 412/464  soft-tissue]
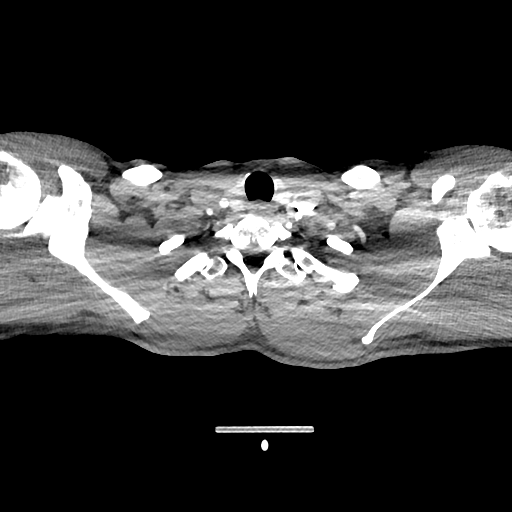
[im 438/464  lung]
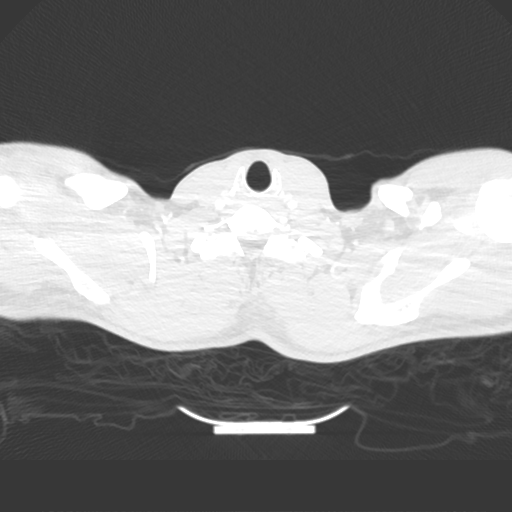

[Series 8: cor · coronal · 0.66mm/px · 3 of 150 slices shown]
[im 38/150  soft-tissue]
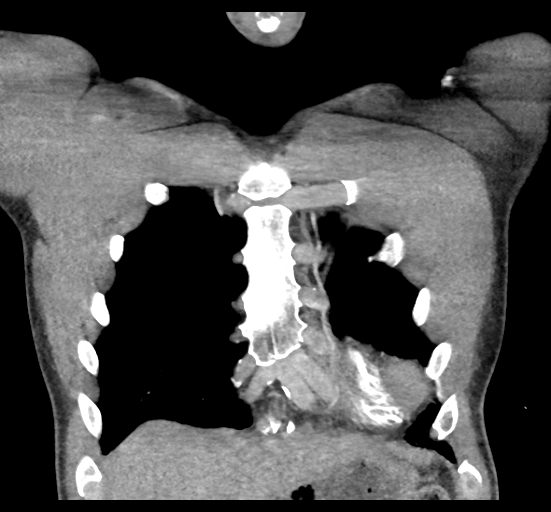
[im 75/150  soft-tissue]
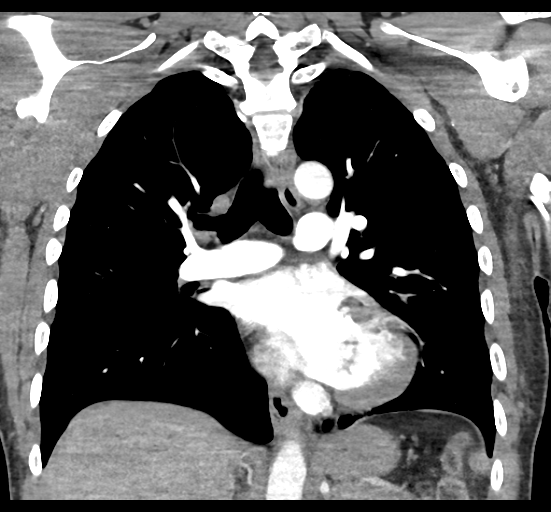
[im 112/150  soft-tissue]
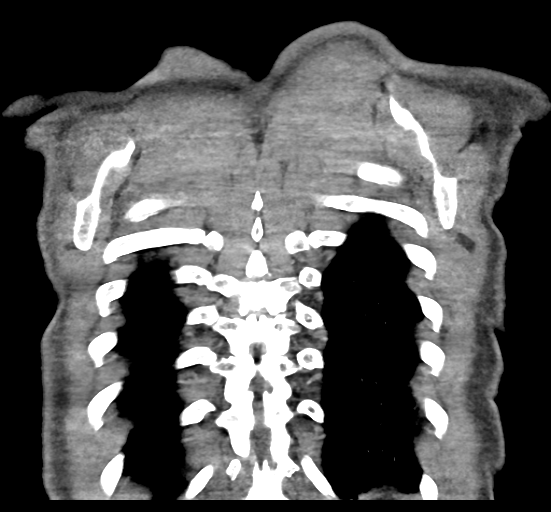

[18 of 46 positions shown; findings below may reference images not displayed]

FINDINGS: Cardiovascular: There is no demonstrable pulmonary embolus. There is
no thoracic aortic aneurysm or dissection. The visualized great
vessels appear unremarkable. There is no appreciable pericardial
effusion or pericardial thickening.

Mediastinum/Nodes: Thyroid appears unremarkable. There is no
appreciable thoracic adenopathy. There is a small hiatal hernia.

Lungs/Pleura: No edema or consolidation. There are areas of slight
atelectatic change bilaterally. No pleural effusion or pleural
thickening evident. On axial slice 86 series 6, there is a 2 mm
nodular opacity in the superior segment of the right lower lobe. On
axial slice 113 series 6, there is a 5 mm nodular opacity abutting
the pleura in the medial segment right middle lobe. On axial slice
138 series 6, there is a 3 mm nodular opacity abutting the pleura in
the medial segment of the right middle lobe. On axial slice 38
series 6, there is a 2 mm nodular opacity in the left upper lobe
near the junction of the anterior and posterior segments.

Upper Abdomen: Visualized upper abdominal structures appear
unremarkable.

Musculoskeletal: There are no blastic or lytic bone lesions. No
chest wall lesions are evident.

Review of the MIP images confirms the above findings.
IMPRESSION: 1. No demonstrable pulmonary embolus. No thoracic aortic aneurysm or
dissection.

2. No edema or consolidation. Several nodular opacities, largest
measuring 5 mm. No follow-up needed if patient is low-risk (and has
no known or suspected primary neoplasm). Non-contrast chest CT can
be considered in 12 months if patient is high-risk. This
recommendation follows the consensus statement: Guidelines for
Management of Incidental Pulmonary Nodules Detected on CT Images:

3.  No demonstrable thoracic adenopathy.

4.  Small hiatal hernia.

## 2020-11-07 ENCOUNTER — Ambulatory Visit (INDEPENDENT_AMBULATORY_CARE_PROVIDER_SITE_OTHER): Payer: No Typology Code available for payment source | Admitting: Behavioral Health

## 2020-11-07 ENCOUNTER — Other Ambulatory Visit: Payer: Self-pay

## 2020-11-07 ENCOUNTER — Encounter: Payer: Self-pay | Admitting: Behavioral Health

## 2020-11-07 DIAGNOSIS — F411 Generalized anxiety disorder: Secondary | ICD-10-CM

## 2020-11-07 DIAGNOSIS — F4310 Post-traumatic stress disorder, unspecified: Secondary | ICD-10-CM | POA: Diagnosis not present

## 2020-11-07 MED ORDER — DULOXETINE HCL 30 MG PO CPEP: 30.0000 mg | ORAL_CAPSULE | Freq: Two times a day (BID) | ORAL | 2 refills | Status: DC

## 2020-11-07 MED ORDER — ALPRAZOLAM 0.5 MG PO TABS
0.5000 mg | ORAL_TABLET | Freq: Two times a day (BID) | ORAL | 2 refills | Status: DC | PRN
Start: 2020-11-07 — End: 2020-12-19

## 2020-11-07 NOTE — Progress Notes (Signed)
[facility] MD/PA/NP Initial Note  11/07/2020 4:29 PM Erik Olson  MRN:  [id number]  Chief Complaint:  Chief Complaint   Trauma; Anxiety; Post-Traumatic Stress Disorder; Medication Refill; Medication Problem; Establish Care     HPI:   60 year old male presents to this office for initial visit and to establish care. He says that he is currently receiving most of his medical care at [facility]. 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 He says that he is here today to establish care due [facility]. He says that he is satisfied with his current medication regimen except for Valium 5 mg tablet that he says is not help in controlling anxiety, anger issues, and other symptoms associated with PTSD. He says that he uses sparingly but xanax has been the medication that has worked best for him when having break through severe anxiety or flash backs. He says that he skips days when he can to avoid tolerance to the medication. He does not want to make any other changes to his medications at this time. Says that he is still tries to be a high functioning individual that is heavily involved with his family. He reports anxiety today 3/10 and depression at 0/10. He says that he is sleeping 6-7 hours per night on average total. No mania present. No psychosis, auditory or visual hallucinations. Denies SI/HI. Agrees to regular follow ups.   Prior medication trial; Valium Hydroxyzine Gabapentin    Visit Diagnosis:    ICD-10-CM   1. PTSD (post-traumatic stress disorder)  F43.10 ALPRAZolam (XANAX) 0.5 MG tablet    DULoxetine (CYMBALTA) 30 MG capsule    2. Generalized anxiety disorder  F41.1 ALPRAZolam (XANAX) 0.5 MG tablet    DULoxetine (CYMBALTA) 30 MG capsule      Past Psychiatric History: PTSD, [facility]   Past Medical History:  Past Medical History:  Diagnosis  Date   Anemia    Anxiety    Arthritis    Bradycardia    Chest pain    Depression    Narrowing of intervertebral disc space    C6-7   Neuropathy    Numbness and tingling in right hand    PTSD (post-traumatic stress disorder)     Past Surgical History:  Procedure Laterality Date   COLONOSCOPY     SPINE SURGERY     with hardware    WISDOM TOOTH EXTRACTION      Family Psychiatric History:see chart  Family History:  Family History  Problem Relation Age of Onset   Depression Mother    Breast cancer Mother    Suicidality Brother     Social History:  Social History   Socioeconomic History   Marital status: Married    Spouse name: Inocencio Homes   Number of children: Not on file   Years of education: 14   Highest education level: Associate degree: occupational, Scientist, product/process development, or vocational program  Occupational History   Not on file  Tobacco Use   Smoking status: Never   Smokeless tobacco: Current    Types: Snuff  Vaping Use   Vaping Use: Never used  Substance and Sexual Activity   Alcohol use: Not Currently   Drug use: No   Sexual activity: Not on file  Other Topics Concern   Not on file  Social History Narrative   Likes working out [employer], [employer]. Lives  in [place] with wife. 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 Care at [facility] in [place]   Social Determinants of Health   Financial Resource Strain: Not on file  Food Insecurity: Not on file  Transportation Needs: Not on file  Physical Activity: Not on file  Stress: Not on file  Social Connections: Not on file    Allergies:  Allergies  Allergen Reactions   Penicillins Anaphylaxis    Has patient had a PCN reaction causing immediate rash, facial/tongue/throat swelling, SOB or lightheadedness with hypotension: Yes Has patient had a PCN reaction causing severe rash involving mucus membranes or skin necrosis: Yes Has patient had a PCN reaction that required  hospitalization: Yes Has patient had a PCN reaction occurring within the last 10 years: no/ If all of the above answers are "NO", then may proceed with Cephalosporin use.     Metabolic Disorder Labs: No results found for: HGBA1C, MPG No results found for: PROLACTIN Lab Results  Component Value Date   CHOL 161 11/03/2016   TRIG 41 11/03/2016   HDL 63 11/03/2016   CHOLHDL 2.6 11/03/2016   VLDL 8 11/03/2016   LDLCALC 90 11/03/2016   Lab Results  Component Value Date   TSH 0.772 07/05/2017   TSH 2.854 11/03/2016    Therapeutic Level Labs: No results found for: LITHIUM No results found for: VALPROATE No components found for:  CBMZ  Current Medications: Current Outpatient Medications  Medication Sig Dispense Refill   acetaminophen (TYLENOL) 325 MG tablet Take by mouth.     ALPRAZolam (XANAX) 0.5 MG tablet Take 1 tablet (0.5 mg total) by mouth 2 (two) times daily as needed for anxiety. 60 tablet 2   gabapentin (NEURONTIN) 300 MG capsule Take 300 mg by mouth 4 (four) times daily.       polyethylene glycol powder (GLYCOLAX/MIRALAX) 17 GM/SCOOP powder TAKE 17 GRAMS BY MOUTH DAILY (MIX WITH WATER OR OTHER LIQUID AS INSTRUCTED BEFORE DRINKING)     predniSONE (DELTASONE) 10 MG tablet Take 1 tablet by mouth daily.     ALPRAZolam (XANAX) 0.5 MG tablet Take 0.5 mg by mouth every 4 (four) hours.   (Patient not taking: Reported on 11/07/2020)     Cholecalciferol 25 MCG (1000 UT) capsule Take by mouth.     DULoxetine (CYMBALTA) 30 MG capsule Take 1 capsule (30 mg total) by mouth 2 (two) times daily. 60 capsule 2   esomeprazole (NEXIUM) 40 MG capsule Take 1 capsule (40 mg total) by mouth daily. (Patient not taking: Reported on 11/07/2020) 30 capsule 0   ferrous sulfate 325 (65 FE) MG tablet Take by mouth. (Patient not taking: Reported on 11/07/2020)     gabapentin (NEURONTIN) 400 MG capsule Take 400 mg by mouth 4 (four) times daily. (Patient not taking: Reported on 11/07/2020)      HYDROcodone-acetaminophen (NORCO) 5-325 MG tablet Take 1-2 tablets by mouth every 6 (six) hours as needed. (Patient not taking: Reported on 11/07/2020) 10 tablet 0   oxyCODONE-acetaminophen (PERCOCET/ROXICET) 5-325 MG tablet Take 1 tablet by mouth every 6 (six) hours as needed. (Patient not taking: Reported on 11/07/2020) 10 tablet 0   pregabalin (LYRICA) 50 MG capsule Take by mouth. (Patient not taking: Reported on 11/07/2020)     ranitidine (ZANTAC) 150 MG tablet Take 150 mg by mouth daily. (Patient not taking: Reported on 11/07/2020)     No current facility-administered medications for this visit.    Medication Side Effects: none  Orders placed this  visit:  No orders of the defined types were placed in this encounter.   Psychiatric Specialty Exam:  Review of Systems  Constitutional: Negative.   Musculoskeletal:  Negative for gait problem.  Allergic/Immunologic: Negative.   Neurological:  Negative for tremors.  Psychiatric/Behavioral:  Positive for sleep disturbance. The patient is nervous/anxious.    Blood pressure (!) 163/90, pulse 73, height 6\' 1"  (1.854 m), weight 267 lb (121.1 kg).Body mass index is 35.23 kg/m.  General Appearance: Casual, Neat, and Well Groomed  Eye Contact:  Good  Speech:  Clear and Coherent  Volume:  Normal  Mood:  NA  Affect:  Appropriate  Thought Process:  Coherent  Orientation:  Full (Time, Place, and Person)  Thought Content: Logical   Suicidal Thoughts:  No  Homicidal Thoughts:  No  Memory:  WNL  Judgement:  Good  Insight:  Good  Psychomotor Activity:  Normal  Concentration:  Concentration: Good  Recall:  Good  Fund of Knowledge: Good  Language: Good  Assets:  Desire for Improvement  ADL's:  Intact  Cognition: WNL  Prognosis:  Good   Screenings:  PHQ2-9    Flowsheet Row Office Visit from 11/07/2020 in [facility]  PHQ-2 Total Score 0       Receiving Psychotherapy: Yes   Treatment Plan/Recommendations:   Continue  with Cymbalta 30 mg twice daily Restart Xanax 0.5 mg twice daily as needed Continue Gabapentin 300 mg 4 times daily Will report worsening symptoms promptly To follow up in 6 weeks to reasess Provided emergency contact information and after hours number. Provided 988 number for Suicide Crisis Hotline.  Greater than 50% of  60 min face to face time with patient was spent on counseling and coordination of care. We discussed his long history of PTSD, service related disability. He currently receives medical [facility] at [facility]. Discussed his current desire to switch from Valium back to xanax. I do not feel that his prior use proposed any additional increased risk considering all aspects of his medical condition and care. Pt agrees to use sparingly and says that he sometimes tries to skip days so medication does not lose its effectiveness. He reports low success with Valium in controlling symptoms of PTSD and also preventing sound sleep.   Discussed potential benefits, risk, and side effects of benzodiazepines to include potential risk of tolerance and dependence, as well as possible drowsiness.  Advised patient not to drive if experiencing drowsiness and to take lowest possible effective dose to minimize risk of dependence and tolerance. Patient agrees to follow up regularly to monitor progress. It is my professional opinion that this individual poses low risk for harm by continuing xanax at this time.   PDMP reviewed   [facility], NP

## 2020-12-19 ENCOUNTER — Encounter: Payer: Self-pay | Admitting: Behavioral Health

## 2020-12-19 ENCOUNTER — Other Ambulatory Visit: Payer: Self-pay

## 2020-12-19 ENCOUNTER — Ambulatory Visit (INDEPENDENT_AMBULATORY_CARE_PROVIDER_SITE_OTHER): Payer: No Typology Code available for payment source | Admitting: Behavioral Health

## 2020-12-19 DIAGNOSIS — F4310 Post-traumatic stress disorder, unspecified: Secondary | ICD-10-CM

## 2020-12-19 DIAGNOSIS — F411 Generalized anxiety disorder: Secondary | ICD-10-CM

## 2020-12-19 MED ORDER — ALPRAZOLAM 0.5 MG PO TABS: 0.5000 mg | ORAL_TABLET | Freq: Two times a day (BID) | ORAL | 3 refills | Status: DC | PRN

## 2020-12-19 NOTE — Progress Notes (Signed)
[facility]  Patient ID: Erik Olson,  MRN: [id number]  [facility]P: [facility]  Date of Evaluation: 12/19/2020 Time spent:40 minutes  Chief Complaint:  Chief Complaint   Trauma; Anxiety; Follow-up; Medication Refill     HISTORY/CURRENT STATUS: HPI 60 year old male presents to this office for follow up and medication refill. He says that he has been doing very well and that his anxiety is controlled with alprazolam. He says that he takes exactly as prescribed and it is the only medication that has worked well for his anxiety related to trauma and PTSD. He says his anxiety today is 2/10 and depression is 0/10. He is sleeping 6-7  hours per night. Changes to his medication regimen is not indicated at this time due to his current stability. He denies mania, no psychosis. No SI/HI.   Prior medication trial; Valium Hydroxyzine Gabapentin   Individual Medical History/ Review of Systems: Changes? :No   Allergies: Penicillins  Current Medications:  Current Outpatient Medications:    acetaminophen (TYLENOL) 325 MG tablet, Take by mouth., Disp: , Rfl:    ALPRAZolam (XANAX) 0.5 MG tablet, Take 0.5 mg by mouth every 4 (four) hours.   (Patient not taking: Reported on 11/07/2020), Disp: , Rfl:    ALPRAZolam (XANAX) 0.5 MG tablet, Take 1 tablet (0.5 mg total) by mouth 2 (two) times daily as needed for anxiety., Disp: 60 tablet, Rfl: 3   Cholecalciferol 25 MCG (1000 UT) capsule, Take by mouth., Disp: , Rfl:    DULoxetine (CYMBALTA) 30 MG capsule, Take 1 capsule (30 mg total) by mouth 2 (two) times daily., Disp: 60 capsule, Rfl: 2   esomeprazole (NEXIUM) 40 MG capsule, Take 1 capsule (40 mg total) by mouth daily. (Patient not taking: Reported on 11/07/2020), Disp: 30 capsule, Rfl: 0   ferrous sulfate 325 (65 FE) MG tablet, Take by mouth. (Patient not taking: Reported on 11/07/2020), Disp: , Rfl:    gabapentin (NEURONTIN) 300 MG capsule, Take 300 mg by mouth 4 (four) times  daily.  , Disp: , Rfl:    gabapentin (NEURONTIN) 400 MG capsule, Take 400 mg by mouth 4 (four) times daily. (Patient not taking: Reported on 11/07/2020), Disp: , Rfl:    HYDROcodone-acetaminophen (NORCO) 5-325 MG tablet, Take 1-2 tablets by mouth every 6 (six) hours as needed. (Patient not taking: Reported on 11/07/2020), Disp: 10 tablet, Rfl: 0   oxyCODONE-acetaminophen (PERCOCET/ROXICET) 5-325 MG tablet, Take 1 tablet by mouth every 6 (six) hours as needed. (Patient not taking: Reported on 11/07/2020), Disp: 10 tablet, Rfl: 0   polyethylene glycol powder (GLYCOLAX/MIRALAX) 17 GM/SCOOP powder, TAKE 17 GRAMS BY MOUTH DAILY (MIX WITH WATER OR OTHER LIQUID AS INSTRUCTED BEFORE DRINKING), Disp: , Rfl:    predniSONE (DELTASONE) 10 MG tablet, Take 1 tablet by mouth daily., Disp: , Rfl:    pregabalin (LYRICA) 50 MG capsule, Take by mouth. (Patient not taking: Reported on 11/07/2020), Disp: , Rfl:    ranitidine (ZANTAC) 150 MG tablet, Take 150 mg by mouth daily. (Patient not taking: Reported on 11/07/2020), Disp: , Rfl:  Medication Side Effects: none  Family Medical/ Social History: Changes? No  MENTAL HEALTH EXAM:  There were no vitals taken for this visit.There is no height or weight on file to calculate BMI.  General Appearance: Casual  Eye Contact:  Good  Speech:  Clear and Coherent  Volume:  Normal  Mood:  NA  Affect:  Appropriate  Thought Process:  Coherent  Orientation:  Full (Time, Place,  and Person)  Thought Content: Logical   Suicidal Thoughts:  No  Homicidal Thoughts:  No  Memory:  WNL  Judgement:  Good  Insight:  Good  Psychomotor Activity:  Normal  Concentration:  Concentration: Good  Recall:  Good  Fund of Knowledge: Good  Language: Good  Assets:  Desire for Improvement  ADL's:  Intact  Cognition: WNL  Prognosis:  Good    DIAGNOSES:    ICD-10-CM   1. PTSD (post-traumatic stress disorder)  F43.10 ALPRAZolam (XANAX) 0.5 MG tablet    2. Generalized anxiety disorder  F41.1  ALPRAZolam (XANAX) 0.5 MG tablet      Receiving Psychotherapy: Yes    RECOMMENDATIONS:   Continue with Cymbalta 30 mg twice daily Restart Xanax 0.5 mg twice daily as needed Continue Gabapentin 300 mg 4 times daily Will report worsening symptoms promptly To follow up in 3 months to reasess Provided emergency contact information and after hours number. Provided 988 number for Suicide Crisis Hotline.   Greater than 50% of  40  min face to face time with patient was spent on counseling and coordination of care. We further reviewed his long history of PTSD, service related disability. He currently receives medical care at [facility]. Discussed his stability and improvement with controlling anxiety.  Discussed potential benefits, risk, and side effects of benzodiazepines to include potential risk of tolerance and dependence, as well as possible drowsiness.  Advised patient not to drive if experiencing drowsiness and to take lowest possible effective dose to minimize risk of dependence and tolerance. Patient agrees to follow up regularly to monitor progress. It is my professional opinion that this individual poses low risk for harm by continuing xanax at this time.    PDMP reviewed       Joan Flores, NP

## 2021-03-20 ENCOUNTER — Ambulatory Visit: Payer: No Typology Code available for payment source | Admitting: Behavioral Health

## 2021-03-29 ENCOUNTER — Telehealth: Payer: Self-pay | Admitting: Behavioral Health

## 2021-03-29 ENCOUNTER — Other Ambulatory Visit: Payer: Self-pay

## 2021-03-29 DIAGNOSIS — F4310 Post-traumatic stress disorder, unspecified: Secondary | ICD-10-CM

## 2021-03-29 DIAGNOSIS — F411 Generalized anxiety disorder: Secondary | ICD-10-CM

## 2021-03-29 MED ORDER — DULOXETINE HCL 30 MG PO CPEP: 30.0000 mg | ORAL_CAPSULE | Freq: Two times a day (BID) | ORAL | 0 refills | Status: DC

## 2021-03-29 NOTE — Telephone Encounter (Signed)
Pt called and said that he needs a refill on his cymbalta 30 mg. Pharmacy is [facility]. Next appt 1/11

## 2021-03-29 NOTE — Telephone Encounter (Signed)
Rx sent 

## 2021-04-03 ENCOUNTER — Ambulatory Visit: Payer: No Typology Code available for payment source | Admitting: Behavioral Health

## 2021-04-15 ENCOUNTER — Other Ambulatory Visit: Payer: Self-pay

## 2021-04-15 ENCOUNTER — Ambulatory Visit (INDEPENDENT_AMBULATORY_CARE_PROVIDER_SITE_OTHER): Payer: No Typology Code available for payment source | Admitting: Behavioral Health

## 2021-04-15 ENCOUNTER — Encounter: Payer: Self-pay | Admitting: Behavioral Health

## 2021-04-15 DIAGNOSIS — F4310 Post-traumatic stress disorder, unspecified: Secondary | ICD-10-CM | POA: Diagnosis not present

## 2021-04-15 DIAGNOSIS — F411 Generalized anxiety disorder: Secondary | ICD-10-CM

## 2021-04-15 MED ORDER — DULOXETINE HCL 30 MG PO CPEP: 30.0000 mg | ORAL_CAPSULE | Freq: Two times a day (BID) | ORAL | 3 refills | Status: DC

## 2021-04-15 MED ORDER — ALPRAZOLAM 0.5 MG PO TABS: 0.5000 mg | ORAL_TABLET | Freq: Two times a day (BID) | ORAL | 3 refills | Status: DC | PRN

## 2021-04-15 MED ORDER — GABAPENTIN 300 MG PO CAPS: 300.0000 mg | ORAL_CAPSULE | Freq: Four times a day (QID) | ORAL | 3 refills | Status: DC

## 2021-04-15 NOTE — Progress Notes (Signed)
[facility]  Patient ID: Erik Olson,  MRN: [id number]  PCP: [facility]  Date of Evaluation: 04/15/2021 Time spent:30 minutes  Chief Complaint:  Chief Complaint   Anxiety; Depression; Follow-up; Medication Refill; Trauma     HISTORY/CURRENT STATUS: HPI  61 year old male presents to this office for follow up and medication refill. He says that he has been doing very well and that his anxiety is controlled with alprazolam. He says that he takes exactly as prescribed and it is the only medication that has worked well for his anxiety related to trauma and PTSD. 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  He was treated and released.He says his anxiety today is 2/10 and depression is 0/10. He is sleeping 6-7  hours per night. Changes to his medication regimen is not indicated at this time due to his current stability. He denies mania, no psychosis. No SI/HI.    Prior medication trial; Valium Hydroxyzine Gabapentin      Individual Medical History/ Review of Systems: Changes? :No   Allergies: Penicillins  Current Medications:  Current Outpatient Medications:    acetaminophen (TYLENOL) 325 MG tablet, Take by mouth., Disp: , Rfl:    ALPRAZolam (XANAX) 0.5 MG tablet, Take 0.5 mg by mouth every 4 (four) hours.   (Patient not taking: Reported on 11/07/2020), Disp: , Rfl:    ALPRAZolam (XANAX) 0.5 MG tablet, Take 1 tablet (0.5 mg total) by mouth 2 (two) times daily as needed for anxiety., Disp: 60 tablet, Rfl: 3   Cholecalciferol 25 MCG (1000 UT) capsule, Take by mouth., Disp: , Rfl:    DULoxetine (CYMBALTA) 30 MG capsule, Take 1 capsule (30 mg total) by mouth 2 (two) times daily., Disp: 60 capsule, Rfl: 3   esomeprazole (NEXIUM) 40 MG capsule, Take 1 capsule (40 mg total) by mouth daily. (Patient not taking: Reported on 11/07/2020), Disp: 30 capsule, Rfl: 0   ferrous sulfate 325 (65 FE) MG tablet, Take by  mouth. (Patient not taking: Reported on 11/07/2020), Disp: , Rfl:    gabapentin (NEURONTIN) 300 MG capsule, Take 1 capsule (300 mg total) by mouth 4 (four) times daily., Disp: 120 capsule, Rfl: 3   gabapentin (NEURONTIN) 400 MG capsule, Take 400 mg by mouth 4 (four) times daily. (Patient not taking: Reported on 11/07/2020), Disp: , Rfl:    HYDROcodone-acetaminophen (NORCO) 5-325 MG tablet, Take 1-2 tablets by mouth every 6 (six) hours as needed. (Patient not taking: Reported on 11/07/2020), Disp: 10 tablet, Rfl: 0   oxyCODONE-acetaminophen (PERCOCET/ROXICET) 5-325 MG tablet, Take 1 tablet by mouth every 6 (six) hours as needed. (Patient not taking: Reported on 11/07/2020), Disp: 10 tablet, Rfl: 0   polyethylene glycol powder (GLYCOLAX/MIRALAX) 17 GM/SCOOP powder, TAKE 17 GRAMS BY MOUTH DAILY (MIX WITH WATER OR OTHER LIQUID AS INSTRUCTED BEFORE DRINKING), Disp: , Rfl:    predniSONE (DELTASONE) 10 MG tablet, Take 1 tablet by mouth daily., Disp: , Rfl:    pregabalin (LYRICA) 50 MG capsule, Take by mouth. (Patient not taking: Reported on 11/07/2020), Disp: , Rfl:    ranitidine (ZANTAC) 150 MG tablet, Take 150 mg by mouth daily. (Patient not taking: Reported on 11/07/2020), Disp: , Rfl:  Medication Side Effects: none  Family Medical/ Social History: Changes? No  MENTAL HEALTH EXAM:  There were no vitals taken for this visit.There is no height or weight on file to calculate BMI.  General Appearance: Casual, Neat, and Well Groomed  Eye Contact:  Good  Speech:  Clear and Coherent  Volume:  Normal  Mood:  NA  Affect:  Appropriate  Thought Process:  Coherent  Orientation:  Full (Time, Place, and Person)  Thought Content: Logical   Suicidal Thoughts:  No  Homicidal Thoughts:  No  Memory:  WNL  Judgement:  Good  Insight:  Good  Psychomotor Activity:  Normal  Concentration:  Concentration: Good  Recall:  Good  Fund of Knowledge: Good  Language: Good  Assets:  Desire for Improvement  ADL's:  Intact   Cognition: WNL  Prognosis:  Good    DIAGNOSES:    ICD-10-CM   1. PTSD (post-traumatic stress disorder)  F43.10 ALPRAZolam (XANAX) 0.5 MG tablet    DULoxetine (CYMBALTA) 30 MG capsule    gabapentin (NEURONTIN) 300 MG capsule    2. Generalized anxiety disorder  F41.1 ALPRAZolam (XANAX) 0.5 MG tablet    DULoxetine (CYMBALTA) 30 MG capsule    gabapentin (NEURONTIN) 300 MG capsule      Receiving Psychotherapy: No    RECOMMENDATIONS:  Continue with Cymbalta 30 mg twice daily Continue Xanax 0.5 mg twice daily as needed Continue Gabapentin 300 mg 4 times daily Will report worsening symptoms promptly To follow up in 3 months to reasess Provided emergency contact information and after hours number. Provided 988 number for Suicide Crisis Hotline.   Greater than 50% of  30 min face to face time with patient was spent on counseling and coordination of care. We further reviewed his long history of PTSD, service related disability. He currently receives medical care at [facility]. Discussed his stability and improvement with controlling anxiety. He recently had another pain episode two weeks ago requiring emergency medical attention due to his idiopathic small fiber disease. Discussed potential benefits, risk, and side effects of benzodiazepines to include potential risk of tolerance and dependence, as well as possible drowsiness.  Advised patient not to drive if experiencing drowsiness and to take lowest possible effective dose to minimize risk of dependence and tolerance. Patient agrees to follow up regularly to monitor progress. It is my professional opinion that this individual poses low risk for harm by continuing xanax at this time.    PDMP reviewed  Elwanda Brooklyn, NP

## 2021-07-15 ENCOUNTER — Encounter: Payer: Self-pay | Admitting: Behavioral Health

## 2021-07-15 ENCOUNTER — Ambulatory Visit (INDEPENDENT_AMBULATORY_CARE_PROVIDER_SITE_OTHER): Payer: No Typology Code available for payment source | Admitting: Behavioral Health

## 2021-07-15 DIAGNOSIS — F411 Generalized anxiety disorder: Secondary | ICD-10-CM

## 2021-07-15 DIAGNOSIS — F4310 Post-traumatic stress disorder, unspecified: Secondary | ICD-10-CM

## 2021-07-15 MED ORDER — DULOXETINE HCL 30 MG PO CPEP: 30.0000 mg | ORAL_CAPSULE | Freq: Two times a day (BID) | ORAL | 3 refills | Status: DC

## 2021-07-15 MED ORDER — ALPRAZOLAM 0.5 MG PO TABS: 0.5000 mg | ORAL_TABLET | Freq: Two times a day (BID) | ORAL | 3 refills | Status: DC | PRN

## 2021-07-15 MED ORDER — GABAPENTIN 300 MG PO CAPS: 300.0000 mg | ORAL_CAPSULE | Freq: Four times a day (QID) | ORAL | 3 refills | Status: DC

## 2021-07-15 NOTE — Progress Notes (Signed)
[facility] ? ?Patient ID: Anderson Malta,  ?MRN: [id number] ? ?[facility]P: [facility] ? ?Date of Evaluation: 07/15/2021 ?Time spent:40 minutes ? ?Chief Complaint:  ? ?HISTORY/CURRENT STATUS: ?HPI ? ?61 year old male presents to this office for follow up and medication refill. No changes since last visit. He says that he has been doing very well and that his anxiety is controlled with Cymbalta and alprazolam. He has experience a couple of episodes of pain last for 24 hrs since last visit. He says his anxiety today is 2/10 and depression is 0/10. He is sleeping 6-7  hours per night. He agrees that changes to his medication regimen is not indicated at this time due to his current stability. He denies mania, no psychosis. No SI/HI.  ?  ?Prior medication trial; ?Valium ?Hydroxyzine ?Gabapentin ?  ? ? ? ?Individual Medical History/ Review of Systems: Changes? :No  ? ?Allergies: Penicillins ? ?Current Medications:  ?Current Outpatient Medications:  ?  acetaminophen (TYLENOL) 325 MG tablet, Take by mouth., Disp: , Rfl:  ?  ALPRAZolam (XANAX) 0.5 MG tablet, Take 0.5 mg by mouth every 4 (four) hours.   (Patient not taking: Reported on 11/07/2020), Disp: , Rfl:  ?  ALPRAZolam (XANAX) 0.5 MG tablet, Take 1 tablet (0.5 mg total) by mouth 2 (two) times daily as needed for anxiety., Disp: 60 tablet, Rfl: 3 ?  Cholecalciferol 25 MCG (1000 UT) capsule, Take by mouth., Disp: , Rfl:  ?  DULoxetine (CYMBALTA) 30 MG capsule, Take 1 capsule (30 mg total) by mouth 2 (two) times daily., Disp: 60 capsule, Rfl: 3 ?  esomeprazole (NEXIUM) 40 MG capsule, Take 1 capsule (40 mg total) by mouth daily. (Patient not taking: Reported on 11/07/2020), Disp: 30 capsule, Rfl: 0 ?  ferrous sulfate 325 (65 FE) MG tablet, Take by mouth. (Patient not taking: Reported on 11/07/2020), Disp: , Rfl:  ?  gabapentin (NEURONTIN) 300 MG capsule, Take 1 capsule (300 mg total) by mouth 4 (four) times daily., Disp: 120 capsule, Rfl: 3 ?  gabapentin  (NEURONTIN) 400 MG capsule, Take 400 mg by mouth 4 (four) times daily. (Patient not taking: Reported on 11/07/2020), Disp: , Rfl:  ?  HYDROcodone-acetaminophen (NORCO) 5-325 MG tablet, Take 1-2 tablets by mouth every 6 (six) hours as needed. (Patient not taking: Reported on 11/07/2020), Disp: 10 tablet, Rfl: 0 ?  oxyCODONE-acetaminophen (PERCOCET/ROXICET) 5-325 MG tablet, Take 1 tablet by mouth every 6 (six) hours as needed. (Patient not taking: Reported on 11/07/2020), Disp: 10 tablet, Rfl: 0 ?  polyethylene glycol powder (GLYCOLAX/MIRALAX) 17 GM/SCOOP powder, TAKE 17 GRAMS BY MOUTH DAILY (MIX WITH WATER OR OTHER LIQUID AS INSTRUCTED BEFORE DRINKING), Disp: , Rfl:  ?  predniSONE (DELTASONE) 10 MG tablet, Take 1 tablet by mouth daily., Disp: , Rfl:  ?  pregabalin (LYRICA) 50 MG capsule, Take by mouth. (Patient not taking: Reported on 11/07/2020), Disp: , Rfl:  ?  ranitidine (ZANTAC) 150 MG tablet, Take 150 mg by mouth daily. (Patient not taking: Reported on 11/07/2020), Disp: , Rfl:  ?Medication Side Effects: none ? ?Family Medical/ Social History: Changes? No ? ?MENTAL HEALTH EXAM: ? ?There were no vitals taken for this visit.There is no height or weight on file to calculate BMI.  ?General Appearance: Casual, Neat, and Well Groomed  ?Eye Contact:  Good  ?Speech:  Clear and Coherent  ?Volume:  Normal  ?Mood:  NA  ?Affect:  Appropriate  ?Thought Process:  Coherent  ?Orientation:  Full (Time, Place, and  Person)  ?Thought Content: Logical   ?Suicidal Thoughts:  No  ?Homicidal Thoughts:  No  ?Memory:  WNL  ?Judgement:  Good  ?Insight:  Good  ?Psychomotor Activity:  Normal  ?Concentration:  Concentration: Good  ?Recall:  Good  ?Fund of Knowledge: Good  ?Language: Good  ?Assets:  Desire for Improvement  ?ADL's:  Intact  ?Cognition: WNL  ?Prognosis:  Good  ? ? ?DIAGNOSES: No diagnosis found. ? ?Receiving Psychotherapy: No  ? ? ?RECOMMENDATIONS:  ? ?Continue with Cymbalta 30 mg twice daily ?Continue Xanax 0.5 mg twice daily as  needed ?Continue Gabapentin 300 mg 4 times daily ?Will report worsening symptoms promptly ?To follow up in 3 months to reasess ?Provided emergency contact information and after hours number. Provided 988 number for Suicide Crisis Hotline. ?  ?Greater than 50% of  30 min face to face time with patient was spent on counseling and coordination of care. We discussed his current level of stability.  He currently receives medical [facility] at [facility]. Discussed his stability and improvement with controlling anxiety. He recently had another pain episode two weeks ago requiring emergency medical attention due to his idiopathic small fiber disease. ?Discussed potential benefits, risk, and side effects of benzodiazepines to include potential risk of tolerance and dependence, as well as possible drowsiness.  Advised patient not to drive if experiencing drowsiness and to take lowest possible effective dose to minimize risk of dependence and tolerance. Patient agrees to follow up regularly to monitor progress. It is my professional opinion that this individual poses low risk for harm by continuing xanax at this time.  ?  ?PDMP reviewed ? ? ?Joan Flores, NP  ?

## 2021-10-14 ENCOUNTER — Encounter: Payer: Self-pay | Admitting: Behavioral Health

## 2021-10-14 ENCOUNTER — Ambulatory Visit (INDEPENDENT_AMBULATORY_CARE_PROVIDER_SITE_OTHER): Payer: No Typology Code available for payment source | Admitting: Behavioral Health

## 2021-10-14 DIAGNOSIS — F4310 Post-traumatic stress disorder, unspecified: Secondary | ICD-10-CM | POA: Diagnosis not present

## 2021-10-14 DIAGNOSIS — F411 Generalized anxiety disorder: Secondary | ICD-10-CM

## 2021-10-14 MED ORDER — ALPRAZOLAM 0.5 MG PO TABS
0.5000 mg | ORAL_TABLET | Freq: Two times a day (BID) | ORAL | 3 refills | Status: DC | PRN
Start: 2021-10-14 — End: 2021-11-26

## 2021-10-14 NOTE — Progress Notes (Signed)
[facility]  Patient ID: Erik Olson,  MRN: [id number]  [facility]  Date of Evaluation: 10/14/2021 Time spent:30 minutes  Chief Complaint:  Chief Complaint   Anxiety; Trauma     HISTORY/CURRENT STATUS: HPI  61 year old male presents to this office for follow up and medication refill. No changes since last visit. He says that he has been doing very well and that his anxiety is controlled with Cymbalta and alprazolam. He is experiencing some changes with family dynamics, elderly father, and sister will be moving in with him. They are searching for larger home to accommodate. He says his anxiety today is 2/10 and depression is 0/10. He is sleeping 6-7  hours per night. He agrees that changes to his medication regimen is not indicated at this time due to his current stability. He denies mania, no psychosis. No SI/HI.    Prior medication trial; Valium Hydroxyzine Gabapentin    Individual Medical History/ Review of Systems: Changes? :No   Allergies: Penicillins  Current Medications:  Current Outpatient Medications:    acetaminophen (TYLENOL) 325 MG tablet, Take by mouth., Disp: , Rfl:    ALPRAZolam (XANAX) 0.5 MG tablet, Take 1 tablet (0.5 mg total) by mouth 2 (two) times daily as needed for anxiety., Disp: 60 tablet, Rfl: 3   Cholecalciferol 25 MCG (1000 UT) capsule, Take by mouth., Disp: , Rfl:    DULoxetine (CYMBALTA) 30 MG capsule, Take 1 capsule (30 mg total) by mouth 2 (two) times daily., Disp: 60 capsule, Rfl: 3   esomeprazole (NEXIUM) 40 MG capsule, Take 1 capsule (40 mg total) by mouth daily. (Patient not taking: Reported on 11/07/2020), Disp: 30 capsule, Rfl: 0   ferrous sulfate 325 (65 FE) MG tablet, Take by mouth. (Patient not taking: Reported on 11/07/2020), Disp: , Rfl:    gabapentin (NEURONTIN) 300 MG capsule, Take 1 capsule (300 mg total) by mouth 4 (four) times daily., Disp: 120 capsule, Rfl: 3   HYDROcodone-acetaminophen (NORCO)  5-325 MG tablet, Take 1-2 tablets by mouth every 6 (six) hours as needed. (Patient not taking: Reported on 11/07/2020), Disp: 10 tablet, Rfl: 0   oxyCODONE-acetaminophen (PERCOCET/ROXICET) 5-325 MG tablet, Take 1 tablet by mouth every 6 (six) hours as needed. (Patient not taking: Reported on 11/07/2020), Disp: 10 tablet, Rfl: 0   polyethylene glycol powder (GLYCOLAX/MIRALAX) 17 GM/SCOOP powder, TAKE 17 GRAMS BY MOUTH DAILY (MIX WITH WATER OR OTHER LIQUID AS INSTRUCTED BEFORE DRINKING), Disp: , Rfl:    predniSONE (DELTASONE) 10 MG tablet, Take 1 tablet by mouth daily., Disp: , Rfl:    pregabalin (LYRICA) 50 MG capsule, Take by mouth. (Patient not taking: Reported on 11/07/2020), Disp: , Rfl:    ranitidine (ZANTAC) 150 MG tablet, Take 150 mg by mouth daily. (Patient not taking: Reported on 11/07/2020), Disp: , Rfl:  Medication Side Effects: none  Family Medical/ Social History: Changes? No  MENTAL HEALTH EXAM:  There were no vitals taken for this visit.There is no height or weight on file to calculate BMI.  General Appearance: Casual, Neat, and Well Groomed  Eye Contact:  Good  Speech:  Clear and Coherent  Volume:  Normal  Mood:  NA  Affect:  Appropriate  Thought Process:  Coherent  Orientation:  Full (Time, Place, and Person)  Thought Content: Logical   Suicidal Thoughts:  No  Homicidal Thoughts:  No  Memory:  WNL  Judgement:  Good  Insight:  Good  Psychomotor Activity:  Normal  Concentration:  Concentration:  Good  Recall:  Good  Fund of Knowledge: Good  Language: Good  Assets:  Desire for Improvement  ADL's:  Intact  Cognition: WNL  Prognosis:  Good    DIAGNOSES:    ICD-10-CM   1. PTSD (post-traumatic stress disorder)  F43.10 ALPRAZolam (XANAX) 0.5 MG tablet    2. Generalized anxiety disorder  F41.1 ALPRAZolam (XANAX) 0.5 MG tablet      Receiving Psychotherapy: No    RECOMMENDATIONS:    Continue with Cymbalta 30 mg twice daily Continue Xanax 0.5 mg twice daily as  needed Continue Gabapentin 300 mg 4 times daily Will report worsening symptoms promptly To follow up in 3 months to reasess Provided emergency contact information and after hours number. Provided 988 number for Suicide Crisis Hotline.   Greater than 50% of  30 min face to face time with patient was spent on counseling and coordination of care. We discussed his current level of stability. He is experiencing some increased levels of stress due to family dynamics.  He currently receives medical care at [facility]. Discussed his stability and improvement with controlling anxiety. Discussed potential benefits, risk, and side effects of benzodiazepines to include potential risk of tolerance and dependence, as well as possible drowsiness.  Advised patient not to drive if experiencing drowsiness and to take lowest possible effective dose to minimize risk of dependence and tolerance. Patient agrees to follow up regularly to monitor progress. It is my professional opinion that this individual poses low risk for harm by continuing xanax at this time. No changes to medications at this time indicated.   PDMP reviewed      Joan Flores, NP

## 2021-11-18 ENCOUNTER — Telehealth: Payer: Self-pay | Admitting: Behavioral Health

## 2021-11-18 DIAGNOSIS — F411 Generalized anxiety disorder: Secondary | ICD-10-CM

## 2021-11-18 DIAGNOSIS — F4310 Post-traumatic stress disorder, unspecified: Secondary | ICD-10-CM

## 2021-11-18 MED ORDER — GABAPENTIN 300 MG PO CAPS: 300.0000 mg | ORAL_CAPSULE | Freq: Four times a day (QID) | ORAL | 3 refills | Status: DC

## 2021-11-18 NOTE — Telephone Encounter (Signed)
Pt called requesting RF Gabapentin 300 mg 4/d to [place] in [place]. Out today. Apt 10/16

## 2021-11-18 NOTE — Telephone Encounter (Signed)
Rx sent 

## 2021-11-26 ENCOUNTER — Other Ambulatory Visit: Payer: Self-pay

## 2021-11-26 ENCOUNTER — Telehealth: Payer: Self-pay | Admitting: Behavioral Health

## 2021-11-26 DIAGNOSIS — F4310 Post-traumatic stress disorder, unspecified: Secondary | ICD-10-CM

## 2021-11-26 DIAGNOSIS — F411 Generalized anxiety disorder: Secondary | ICD-10-CM

## 2021-11-26 MED ORDER — ALPRAZOLAM 0.5 MG PO TABS: 0.5000 mg | ORAL_TABLET | Freq: Two times a day (BID) | ORAL | 0 refills | Status: DC | PRN

## 2021-11-26 NOTE — Telephone Encounter (Signed)
Empty message

## 2021-12-12 ENCOUNTER — Ambulatory Visit (INDEPENDENT_AMBULATORY_CARE_PROVIDER_SITE_OTHER): Payer: No Typology Code available for payment source | Admitting: Behavioral Health

## 2021-12-12 ENCOUNTER — Encounter: Payer: Self-pay | Admitting: Behavioral Health

## 2021-12-12 DIAGNOSIS — F411 Generalized anxiety disorder: Secondary | ICD-10-CM | POA: Diagnosis not present

## 2021-12-12 DIAGNOSIS — F4310 Post-traumatic stress disorder, unspecified: Secondary | ICD-10-CM

## 2021-12-12 NOTE — Progress Notes (Signed)
[facility]  Patient ID: Erik Olson,  MRN: [id number]  PCP: [facility]  Date of Evaluation: 12/12/2021 Time spent:30 minutes  Chief Complaint:  Chief Complaint   Anxiety     HISTORY/CURRENT STATUS: HPI  "Erik Olson", 61 year old male presents to this office for follow up and medication refill. Says that he has been under a great deal of stress recently leading to increased anxiety. Reports his family was unable to acquire a new larger home where the entire family could reside to assist in mutual care. Says he has been more irritable and unlike himself at times. This has required him to utilize 3 xanax per day but says he will cut back down to two when things smooth out a bit. Marland Kitchen He says his anxiety today is 4/10 and depression is 0/10. He is sleeping 6-7  hours per night. He agrees that changes to his medication regimen is not indicated at this time due to his current stability. He denies mania, no psychosis. No SI/HI.    Prior medication trial; Valium Hydroxyzine Gabapentin    Individual Medical History/ Review of Systems: Changes? :No   Allergies: Penicillins  Current Medications:  Current Outpatient Medications:    acetaminophen (TYLENOL) 325 MG tablet, Take by mouth., Disp: , Rfl:    ALPRAZolam (XANAX) 0.5 MG tablet, Take 1 tablet (0.5 mg total) by mouth 2 (two) times daily as needed for anxiety., Disp: 60 tablet, Rfl: 0   Cholecalciferol 25 MCG (1000 UT) capsule, Take by mouth., Disp: , Rfl:    DULoxetine (CYMBALTA) 30 MG capsule, Take 1 capsule (30 mg total) by mouth 2 (two) times daily., Disp: 60 capsule, Rfl: 3   esomeprazole (NEXIUM) 40 MG capsule, Take 1 capsule (40 mg total) by mouth daily. (Patient not taking: Reported on 11/07/2020), Disp: 30 capsule, Rfl: 0   ferrous sulfate 325 (65 FE) MG tablet, Take by mouth. (Patient not taking: Reported on 11/07/2020), Disp: , Rfl:    gabapentin (NEURONTIN) 300 MG capsule, Take 1 capsule (300 mg total) by  mouth 4 (four) times daily., Disp: 120 capsule, Rfl: 3   HYDROcodone-acetaminophen (NORCO) 5-325 MG tablet, Take 1-2 tablets by mouth every 6 (six) hours as needed. (Patient not taking: Reported on 11/07/2020), Disp: 10 tablet, Rfl: 0   oxyCODONE-acetaminophen (PERCOCET/ROXICET) 5-325 MG tablet, Take 1 tablet by mouth every 6 (six) hours as needed. (Patient not taking: Reported on 11/07/2020), Disp: 10 tablet, Rfl: 0   polyethylene glycol powder (GLYCOLAX/MIRALAX) 17 GM/SCOOP powder, TAKE 17 GRAMS BY MOUTH DAILY (MIX WITH WATER OR OTHER LIQUID AS INSTRUCTED BEFORE DRINKING), Disp: , Rfl:    predniSONE (DELTASONE) 10 MG tablet, Take 1 tablet by mouth daily., Disp: , Rfl:    pregabalin (LYRICA) 50 MG capsule, Take by mouth. (Patient not taking: Reported on 11/07/2020), Disp: , Rfl:    ranitidine (ZANTAC) 150 MG tablet, Take 150 mg by mouth daily. (Patient not taking: Reported on 11/07/2020), Disp: , Rfl:  Medication Side Effects: none  Family Medical/ Social History: Changes? No  MENTAL HEALTH EXAM:  There were no vitals taken for this visit.There is no height or weight on file to calculate BMI.  General Appearance: Casual, Neat, and Well Groomed  Eye Contact:  Good  Speech:  Clear and Coherent  Volume:  Normal  Mood:  Anxious  Affect:  Appropriate  Thought Process:  Coherent  Orientation:  Full (Time, Place, and Person)  Thought Content: Logical   Suicidal Thoughts:  No  Homicidal Thoughts:  No  Memory:  WNL  Judgement:  Good  Insight:  Good  Psychomotor Activity:  Normal  Concentration:  Concentration: Good  Recall:  Good  Fund of Knowledge: Good  Language: Good  Assets:  Desire for Improvement  ADL's:  Intact  Cognition: WNL  Prognosis:  Good    DIAGNOSES:    ICD-10-CM   1. PTSD (post-traumatic stress disorder)  F43.10     2. Generalized anxiety disorder  F41.1       Receiving Psychotherapy: No    Continue with Cymbalta 30 mg twice daily Continue Xanax 0.5 mg twice  daily as needed Continue Gabapentin 300 mg 4 times daily Will report worsening symptoms promptly To follow up in 3 months to reasess Provided emergency contact information and after hours number. Provided 988 number for Suicide Crisis Hotline.   Greater than 50% of  30 min face to face time with patient was spent on counseling and coordination of care. We discussed his current level of stability. He is experiencing some increased levels of stress due to family dynamics and need for larger home to promote better family care. He, his wife, sister, elderly father, and son are looking to collaborate into living in same home. Recent agreement with mortgage broker fell through causing tremendous stress on family. He has increased xanax to 0.5 mg, three times daily due to additional stress and anxiety but will reduce in a few weeks. I am not concerned with additional risk at this time. Patient is very candid and cautious with his use.  Discussed potential benefits, risk, and side effects of benzodiazepines to include potential risk of tolerance and dependence, as well as possible drowsiness.  Advised patient not to drive if experiencing drowsiness and to take lowest possible effective dose to minimize risk of dependence and tolerance. Patient agrees to follow up regularly to monitor progress. It is my professional opinion that this individual poses low risk for harm by continuing xanax at this time. No changes to medications at this time indicated.   PDMP reviewed      Joan Flores, NP

## 2022-01-06 ENCOUNTER — Ambulatory Visit: Payer: No Typology Code available for payment source | Admitting: Behavioral Health

## 2022-01-09 ENCOUNTER — Encounter: Payer: Self-pay | Admitting: Behavioral Health

## 2022-01-09 ENCOUNTER — Ambulatory Visit (INDEPENDENT_AMBULATORY_CARE_PROVIDER_SITE_OTHER): Payer: No Typology Code available for payment source | Admitting: Behavioral Health

## 2022-01-09 DIAGNOSIS — F411 Generalized anxiety disorder: Secondary | ICD-10-CM | POA: Diagnosis not present

## 2022-01-09 DIAGNOSIS — F4310 Post-traumatic stress disorder, unspecified: Secondary | ICD-10-CM | POA: Diagnosis not present

## 2022-01-09 NOTE — Progress Notes (Signed)
[facility]  Patient ID: AASIM RESTIVO,  MRN: [id number]  PCP: [facility]  Date of Evaluation: 01/09/2022 Time spent:30 minutes  Chief Complaint:  Chief Complaint   Anxiety; Trauma; Medication Refill; Follow-up; Depression; Patient Education     HISTORY/CURRENT STATUS: HPI   "Don", 61 year old male presents to this office for follow up and medication refill. Says that he has been under increased stress with family situations. Reports his family was unable to acquire a new larger home where the entire family could reside to assist in mutual care. They have currently stopped looking due to market condition. 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 This has required him to utilize 3 xanax per day but says he will cut back down to two when things smooth out a bit. Marland Kitchen He says his anxiety today is 4/10 and depression is 0/10. He is sleeping 6-7  hours per night. He agrees that changes to his medication regimen is not indicated at this time due to his current stability. He denies mania, no psychosis. No SI/HI.    Prior medication trial; Valium Hydroxyzine Gabapentin   Individual Medical History/ Review of Systems: Changes? :No   Allergies: Penicillins  Current Medications:  Current Outpatient Medications:    acetaminophen (TYLENOL) 325 MG tablet, Take by mouth., Disp: , Rfl:    ALPRAZolam (XANAX) 0.5 MG tablet, Take 1 tablet (0.5 mg total) by mouth 2 (two) times daily as needed for anxiety., Disp: 60 tablet, Rfl: 0   Cholecalciferol 25 MCG (1000 UT) capsule, Take by mouth., Disp: , Rfl:    DULoxetine (CYMBALTA) 30 MG capsule, Take 1 capsule (30 mg total) by mouth 2 (two) times daily., Disp: 60 capsule, Rfl: 3   esomeprazole (NEXIUM) 40 MG capsule, Take 1 capsule (40 mg total) by mouth daily. (Patient not taking: Reported on 11/07/2020), Disp: 30 capsule, Rfl: 0   ferrous sulfate 325 (65 FE) MG tablet, Take by mouth.  (Patient not taking: Reported on 11/07/2020), Disp: , Rfl:    gabapentin (NEURONTIN) 300 MG capsule, Take 1 capsule (300 mg total) by mouth 4 (four) times daily., Disp: 120 capsule, Rfl: 3   HYDROcodone-acetaminophen (NORCO) 5-325 MG tablet, Take 1-2 tablets by mouth every 6 (six) hours as needed. (Patient not taking: Reported on 11/07/2020), Disp: 10 tablet, Rfl: 0   oxyCODONE-acetaminophen (PERCOCET/ROXICET) 5-325 MG tablet, Take 1 tablet by mouth every 6 (six) hours as needed. (Patient not taking: Reported on 11/07/2020), Disp: 10 tablet, Rfl: 0   polyethylene glycol powder (GLYCOLAX/MIRALAX) 17 GM/SCOOP powder, TAKE 17 GRAMS BY MOUTH DAILY (MIX WITH WATER OR OTHER LIQUID AS INSTRUCTED BEFORE DRINKING), Disp: , Rfl:    predniSONE (DELTASONE) 10 MG tablet, Take 1 tablet by mouth daily., Disp: , Rfl:    pregabalin (LYRICA) 50 MG capsule, Take by mouth. (Patient not taking: Reported on 11/07/2020), Disp: , Rfl:    ranitidine (ZANTAC) 150 MG tablet, Take 150 mg by mouth daily. (Patient not taking: Reported on 11/07/2020), Disp: , Rfl:  Medication Side Effects: none  Family Medical/ Social History: Changes? No  MENTAL HEALTH EXAM:  There were no vitals taken for this visit.There is no height or weight on file to calculate BMI.  General Appearance: Casual, Neat, and Well Groomed  Eye Contact:  Good  Speech:  Clear and Coherent  Volume:  Normal  Mood:  NA  Affect:  Appropriate  Thought Process:  Coherent  Orientation:  Full (Time, Place, and Person)  Thought Content: Logical   Suicidal Thoughts:  No  Homicidal Thoughts:  No  Memory:  WNL  Judgement:  Good  Insight:  Good  Psychomotor Activity:  Normal  Concentration:  Concentration: Good  Recall:  Good  Fund of Knowledge: Good  Language: Good  Assets:  Desire for Improvement  ADL's:  Intact  Cognition: WNL  Prognosis:  Good    DIAGNOSES:    ICD-10-CM   1. PTSD (post-traumatic stress disorder)  F43.10     2. Generalized anxiety  disorder  F41.1       Receiving Psychotherapy: No    RECOMMENDATIONS:  Continue with Cymbalta 30 mg twice daily Continue Xanax 0.5 mg three time daily as needed. To reduce back to two per day when stressors are reduced. Continue Gabapentin 300 mg 4 times daily Will report worsening symptoms promptly To follow up in 3 months to reasess Provided emergency contact information and after hours number. Provided 988 number for Suicide Crisis Hotline.   Greater than 50% of  30 min face to face time with patient was spent on counseling and coordination of care. We discussed his current level of stability. He continues to experience some increased levels of stress due to family dynamics and need for larger home to promote better family care. He, his wife, sister, elderly father, and son are looking to collaborate into living in same home. Family has stopped looking for mutual home at the current time due to additional cost. Will look again when market improves. Patient is very candid and cautious with his benzo use. No concerned with additional risk at this time.  Discussed potential benefits, risk, and side effects of benzodiazepines to include potential risk of tolerance and dependence, as well as possible drowsiness.  Advised patient not to drive if experiencing drowsiness and to take lowest possible effective dose to minimize risk of dependence and tolerance. Patient agrees to follow up regularly to monitor progress. It is my professional opinion that this individual poses low risk for harm by continuing xanax at this time. No changes to medications at this time indicated.   PDMP reviewed     Joan Flores, NP

## 2022-01-13 ENCOUNTER — Ambulatory Visit: Payer: No Typology Code available for payment source | Admitting: Behavioral Health

## 2022-01-27 ENCOUNTER — Other Ambulatory Visit: Payer: Self-pay | Admitting: Behavioral Health

## 2022-01-27 ENCOUNTER — Telehealth: Payer: Self-pay | Admitting: Behavioral Health

## 2022-01-27 DIAGNOSIS — F4310 Post-traumatic stress disorder, unspecified: Secondary | ICD-10-CM

## 2022-01-27 DIAGNOSIS — F411 Generalized anxiety disorder: Secondary | ICD-10-CM

## 2022-01-27 NOTE — Telephone Encounter (Signed)
Filled 10/17 due 11/14

## 2022-01-27 NOTE — Telephone Encounter (Signed)
Pt LVM @ 9:10a.  He would like refill of Xanax sent to  [id number] [place], [address], [place] [address]   No upcoming appts scheduled.

## 2022-01-28 ENCOUNTER — Other Ambulatory Visit: Payer: Self-pay

## 2022-01-28 DIAGNOSIS — F411 Generalized anxiety disorder: Secondary | ICD-10-CM

## 2022-01-28 DIAGNOSIS — F4310 Post-traumatic stress disorder, unspecified: Secondary | ICD-10-CM

## 2022-01-28 MED ORDER — ALPRAZOLAM 0.5 MG PO TABS: 0.5000 mg | ORAL_TABLET | Freq: Three times a day (TID) | ORAL | 0 refills | Status: DC | PRN

## 2022-01-28 NOTE — Telephone Encounter (Signed)
Pt called back again and said that he is taking 3  xanax a day. He saw brian 2 weeks ago and brian told him to call and he would send in a new script with dose change. He will be out tomorrow

## 2022-01-28 NOTE — Telephone Encounter (Signed)
Erik Olson called to check status of refill of his Xanax.  He said [name] increased his dose to 3/day so he is running out early.  [name] knew this would happen and told him to call when he needed the refill.  Please send in new prescription for the new dose.  He will be out today.  Made his f/u appt for 04/02/22.

## 2022-03-13 ENCOUNTER — Other Ambulatory Visit: Payer: Self-pay

## 2022-03-13 ENCOUNTER — Telehealth: Payer: Self-pay | Admitting: Behavioral Health

## 2022-03-13 DIAGNOSIS — F4310 Post-traumatic stress disorder, unspecified: Secondary | ICD-10-CM

## 2022-03-13 DIAGNOSIS — F411 Generalized anxiety disorder: Secondary | ICD-10-CM

## 2022-03-13 MED ORDER — GABAPENTIN 300 MG PO CAPS: 300.0000 mg | ORAL_CAPSULE | Freq: Four times a day (QID) | ORAL | 0 refills | Status: DC

## 2022-03-13 MED ORDER — ALPRAZOLAM 0.5 MG PO TABS: 0.5000 mg | ORAL_TABLET | Freq: Three times a day (TID) | ORAL | 0 refills | Status: DC | PRN

## 2022-03-13 NOTE — Telephone Encounter (Signed)
Pended.

## 2022-03-13 NOTE — Telephone Encounter (Signed)
Pt LVM @ 4:10p on 12/20.  He would like refill of Gabapentin and Xanax sent to   [facility] - [place], [place] - [address] [facility] [address] [place] [address] Phone: [phone]  Fax: [phone]    Next appt 1/10

## 2022-04-02 ENCOUNTER — Telehealth (INDEPENDENT_AMBULATORY_CARE_PROVIDER_SITE_OTHER): Payer: No Typology Code available for payment source | Admitting: Behavioral Health

## 2022-04-02 ENCOUNTER — Encounter: Payer: Self-pay | Admitting: Behavioral Health

## 2022-04-02 DIAGNOSIS — F411 Generalized anxiety disorder: Secondary | ICD-10-CM

## 2022-04-02 DIAGNOSIS — F4310 Post-traumatic stress disorder, unspecified: Secondary | ICD-10-CM | POA: Diagnosis not present

## 2022-04-02 MED ORDER — ALPRAZOLAM 0.5 MG PO TABS: 0.5000 mg | ORAL_TABLET | Freq: Three times a day (TID) | ORAL | 5 refills | Status: DC | PRN

## 2022-04-02 MED ORDER — GABAPENTIN 300 MG PO CAPS: 300.0000 mg | ORAL_CAPSULE | Freq: Four times a day (QID) | ORAL | 0 refills | Status: DC

## 2022-04-02 NOTE — Progress Notes (Signed)
Erik Olson [id number] [birth year] 62 y.o.  Virtual Visit via Video Note  I connected with pt @ on 04/02/22 at  1:30 PM EST by a video enabled telemedicine application and verified that I am speaking with the correct person using two identifiers.   I discussed the limitations of evaluation and management by telemedicine and the availability of in person appointments. The patient expressed understanding and agreed to proceed.  I discussed the assessment and treatment plan with the patient. The patient was provided an opportunity to ask questions and all were answered. The patient agreed with the plan and demonstrated an understanding of the instructions.   The patient was advised to call back or seek an in-person evaluation if the symptoms worsen or if the condition fails to improve as anticipated.  I provided 30 minutes of non-face-to-face time during this encounter.  The patient was located at home.  The provider was located at [place].   Elwanda Brooklyn, NP   Subjective:   Patient ID:  Erik Olson is a 62 y.o. (DOB April 10, 1960) male.  Chief Complaint:  Chief Complaint  Patient presents with   Anxiety   Trauma   Follow-up   Patient Education   Medication Refill    HPI "Erik Olson", 62 year old male presents to this office via video visit for follow up and medication refill. Says that he requested video visit this time due to whole family being sick with upper respiratory illness. He has been doing well on current medications. He is requesting no changes at this time  He says his anxiety today is 3/10 and depression is 0/10. He is sleeping 6-7  hours per night. He agrees that changes to his medication regimen is not indicated at this time due to his current stability. He denies mania, no psychosis. No SI/HI.    Prior medication trial; Valium Hydroxyzine Gabapentin   Review of Systems:  Review of Systems  Constitutional: Negative.   Allergic/Immunologic:  Negative.   Neurological: Negative.   Psychiatric/Behavioral:  The patient is nervous/anxious.     Medications: I have reviewed the patient's current medications.  Current Outpatient Medications  Medication Sig Dispense Refill   acetaminophen (TYLENOL) 325 MG tablet Take by mouth.     ALPRAZolam (XANAX) 0.5 MG tablet Take 1 tablet (0.5 mg total) by mouth 3 (three) times daily as needed for anxiety. 90 tablet 5   Cholecalciferol 25 MCG (1000 UT) capsule Take by mouth.     DULoxetine (CYMBALTA) 30 MG capsule Take 1 capsule (30 mg total) by mouth 2 (two) times daily. 60 capsule 3   esomeprazole (NEXIUM) 40 MG capsule Take 1 capsule (40 mg total) by mouth daily. (Patient not taking: Reported on 11/07/2020) 30 capsule 0   ferrous sulfate 325 (65 FE) MG tablet Take by mouth. (Patient not taking: Reported on 11/07/2020)     gabapentin (NEURONTIN) 300 MG capsule Take 1 capsule (300 mg total) by mouth 4 (four) times daily. 120 capsule 0   HYDROcodone-acetaminophen (NORCO) 5-325 MG tablet Take 1-2 tablets by mouth every 6 (six) hours as needed. (Patient not taking: Reported on 11/07/2020) 10 tablet 0   oxyCODONE-acetaminophen (PERCOCET/ROXICET) 5-325 MG tablet Take 1 tablet by mouth every 6 (six) hours as needed. (Patient not taking: Reported on 11/07/2020) 10 tablet 0   polyethylene glycol powder (GLYCOLAX/MIRALAX) 17 GM/SCOOP powder TAKE 17 GRAMS BY MOUTH DAILY (MIX WITH WATER OR OTHER LIQUID AS INSTRUCTED BEFORE DRINKING)     predniSONE (DELTASONE) 10  MG tablet Take 1 tablet by mouth daily.     pregabalin (LYRICA) 50 MG capsule Take by mouth. (Patient not taking: Reported on 11/07/2020)     ranitidine (ZANTAC) 150 MG tablet Take 150 mg by mouth daily. (Patient not taking: Reported on 11/07/2020)     No current facility-administered medications for this visit.    Medication Side Effects: None  Allergies:  Allergies  Allergen Reactions   Penicillins Anaphylaxis    Has patient had a PCN reaction  causing immediate rash, facial/tongue/throat swelling, SOB or lightheadedness with hypotension: Yes Has patient had a PCN reaction causing severe rash involving mucus membranes or skin necrosis: Yes Has patient had a PCN reaction that required hospitalization: Yes Has patient had a PCN reaction occurring within the last 10 years: no/ If all of the above answers are "NO", then may proceed with Cephalosporin use.     Past Medical History:  Diagnosis Date   Anemia    Anxiety    Arthritis    Bradycardia    Chest pain    Depression    Narrowing of intervertebral disc space    C6-7   Neuropathy    Numbness and tingling in right hand    PTSD (post-traumatic stress disorder)     Family History  Problem Relation Age of Onset   Depression Mother    Breast cancer Mother    Suicidality Brother     Social History   Socioeconomic History   Marital status: Married    Spouse name: Inocencio Homes   Number of children: Not on file   Years of education: 14   Highest education level: Associate degree: occupational, Scientist, product/process development, or vocational program  Occupational History   Not on file  Tobacco Use   Smoking status: Never   Smokeless tobacco: Current    Types: Snuff  Vaping Use   Vaping Use: Never used  Substance and Sexual Activity   Alcohol use: Not Currently   Drug use: No   Sexual activity: Not on file  Other Topics Concern   Not on file  Social History Narrative   Likes working out [employer], [employer]. Lives in [place] with wife. 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 Care at [facility] in [place]   Social Determinants of Health   Financial Resource Strain: Not on file  Food Insecurity: Not on file  Transportation Needs: Not on file  Physical Activity: Not on file  Stress: Not on file  Social Connections: Not on file  Intimate Partner Violence: Not on file    Past Medical History, Surgical history, Social history, and Family history  were reviewed and updated as appropriate.   Please see review of systems for further details on the patient's review from today.   Objective:   Physical Exam:  There were no vitals taken for this visit.  Physical Exam Constitutional:      General: He is not in acute distress.    Appearance: Normal appearance.  Neurological:     Mental Status: He is alert and oriented to person, place, and time.     Gait: Gait normal.  Psychiatric:        Attention and Perception: Attention and perception normal. He does not perceive auditory or visual hallucinations.        Mood and Affect: Mood and affect normal. Mood is not anxious or depressed. Affect is not labile.        Speech: Speech normal.  Behavior: Behavior normal. Behavior is cooperative.        Thought Content: Thought content normal.        Cognition and Memory: Cognition and memory normal.        Judgment: Judgment normal.     Lab Review:     Component Value Date/Time   NA 134 (L) 09/09/2018 1658   K 4.4 09/09/2018 1658   CL 102 09/09/2018 1658   CO2 24 09/09/2018 1658   GLUCOSE 90 09/09/2018 1658   BUN 13 09/09/2018 1658   CREATININE 1.13 09/09/2018 1658   CALCIUM 9.1 09/09/2018 1658   PROT 7.4 03/25/2018 1256   ALBUMIN 4.5 03/25/2018 1256   AST 24 03/25/2018 1256   ALT 22 03/25/2018 1256   ALKPHOS 58 03/25/2018 1256   BILITOT 0.7 03/25/2018 1256   GFRNONAA >60 09/09/2018 1658   GFRAA >60 09/09/2018 1658       Component Value Date/Time   WBC 5.2 09/09/2018 1658   RBC 3.99 (L) 09/09/2018 1658   HGB 13.7 09/09/2018 1658   HCT 40.6 09/09/2018 1658   PLT 211 09/09/2018 1658   MCV 101.8 (H) 09/09/2018 1658   MCH 34.3 (H) 09/09/2018 1658   MCHC 33.7 09/09/2018 1658   RDW 12.4 09/09/2018 1658   LYMPHSABS 1.4 09/09/2018 1658   MONOABS 0.3 09/09/2018 1658   EOSABS 0.2 09/09/2018 1658   BASOSABS 0.0 09/09/2018 1658    No results found for: "POCLITH", "LITHIUM"   No results found for: "PHENYTOIN",  "PHENOBARB", "VALPROATE", "CBMZ"   .res Assessment: Plan:    RECOMMENDATIONS:   Continue with Cymbalta 30 mg twice daily Continue Xanax 0.5 mg three time daily as needed. Will reduce back to two per day when stressors are reduced.  Continue Gabapentin  300 mg four times daily. Continue Gabapentin 300 mg 4 times daily Will report worsening symptoms promptly To follow up in 3 months to reasess Provided emergency contact information and after hours number. Provided 988 number for Suicide Crisis Hotline.   Greater than 50% of  30 min video visit time with patient was spent on counseling and coordination of care. We discussed his current level of stability. He continues to experience some increased levels of stress due to family dynamics.   Patient is very candid and cautious with his benzo use. No concerned with additional risk at this time.  Discussed potential benefits, risk, and side effects of benzodiazepines to include potential risk of tolerance and dependence, as well as possible drowsiness.  Advised patient not to drive if experiencing drowsiness and to take lowest possible effective dose to minimize risk of dependence and tolerance. Patient agrees to follow up regularly to monitor progress. It is my professional opinion that this individual poses low risk for harm by continuing xanax at this time. No changes to medications at this time indicated.   PDMP reviewed      Elwanda Brooklyn, NP            Riven was seen today for anxiety, trauma, follow-up, patient education and medication refill.  Diagnoses and all orders for this visit:  Generalized anxiety disorder -     ALPRAZolam (XANAX) 0.5 MG tablet; Take 1 tablet (0.5 mg total) by mouth 3 (three) times daily as needed for anxiety. -     gabapentin (NEURONTIN) 300 MG capsule; Take 1 capsule (300 mg total) by mouth 4 (four) times daily.  PTSD (post-traumatic stress disorder) -     ALPRAZolam (XANAX) 0.5 MG tablet;  Take 1 tablet  (0.5 mg total) by mouth 3 (three) times daily as needed for anxiety. -     gabapentin (NEURONTIN) 300 MG capsule; Take 1 capsule (300 mg total) by mouth 4 (four) times daily.     Please see After Visit Summary for patient specific instructions.  No future appointments.  No orders of the defined types were placed in this encounter.     -------------------------------

## 2022-04-11 ENCOUNTER — Other Ambulatory Visit: Payer: Self-pay

## 2022-04-11 ENCOUNTER — Telehealth: Payer: Self-pay | Admitting: Behavioral Health

## 2022-04-11 DIAGNOSIS — F4310 Post-traumatic stress disorder, unspecified: Secondary | ICD-10-CM

## 2022-04-11 DIAGNOSIS — F411 Generalized anxiety disorder: Secondary | ICD-10-CM

## 2022-04-11 MED ORDER — GABAPENTIN 300 MG PO CAPS: 300.0000 mg | ORAL_CAPSULE | Freq: Four times a day (QID) | ORAL | 0 refills | Status: DC

## 2022-04-11 NOTE — Telephone Encounter (Signed)
Rx sent 

## 2022-04-11 NOTE — Telephone Encounter (Signed)
Erik Olson stating on 1/10 his Xanax and Gabapentin was submitted for refill at the [facility]. He stated the Gabapentin was not filled. Please submit so he can pick up on today. Patient also stated the [facility] will be sending a renewal form for [name] to sign so he can continue care.  Patient is requesting a call to confirm Rx is ready. # [phone]

## 2022-05-06 ENCOUNTER — Telehealth: Payer: Self-pay | Admitting: Behavioral Health

## 2022-05-06 NOTE — Telephone Encounter (Signed)
The [facility] issue is something I am trying to help resolve, but they won't fill it there right now.

## 2022-05-06 NOTE — Telephone Encounter (Signed)
Pt is requesting a 30 day RF of Xanax and [facility]ap[facility]. [facility] is delayed in getting it from the [place] and needs it now so he will pay [facility] of pocket. [id number] [place]

## 2022-05-06 NOTE — Telephone Encounter (Signed)
Due for RF on gabapentin.  Xanax last filled 1/18, due 2/15. [facility] with [facility], as patient has refills available for [facility]ax.

## 2022-05-06 NOTE — Telephone Encounter (Signed)
Patient is due for a RF on Xanax on 2/15.  He said he had enough gabapentin to send a RF for that on 2/15 as well. I have postponed the request to address on 2/15. He said he/we had been having issues with his RF. Just wanted to let you know that he and I are on the same page with that he needs.

## 2022-05-08 ENCOUNTER — Other Ambulatory Visit: Payer: Self-pay

## 2022-05-08 DIAGNOSIS — F411 Generalized anxiety disorder: Secondary | ICD-10-CM

## 2022-05-08 DIAGNOSIS — F4310 Post-traumatic stress disorder, unspecified: Secondary | ICD-10-CM

## 2022-05-08 MED ORDER — ALPRAZOLAM 0.5 MG PO TABS: 0.5000 mg | ORAL_TABLET | Freq: Three times a day (TID) | ORAL | 0 refills | Status: DC | PRN

## 2022-05-08 MED ORDER — GABAPENTIN 300 MG PO CAPS: 300.0000 mg | ORAL_CAPSULE | Freq: Four times a day (QID) | ORAL | 0 refills | Status: DC

## 2022-05-08 NOTE — Telephone Encounter (Signed)
Gabapentin sent, pended alprazolam to local pharmacy.

## 2022-05-12 ENCOUNTER — Other Ambulatory Visit: Payer: Self-pay

## 2022-05-12 DIAGNOSIS — F4310 Post-traumatic stress disorder, unspecified: Secondary | ICD-10-CM

## 2022-05-12 DIAGNOSIS — F411 Generalized anxiety disorder: Secondary | ICD-10-CM

## 2022-05-12 MED ORDER — GABAPENTIN 300 MG PO CAPS: 300.0000 mg | ORAL_CAPSULE | Freq: Four times a day (QID) | ORAL | 1 refills | Status: DC

## 2022-06-25 ENCOUNTER — Ambulatory Visit: Payer: No Typology Code available for payment source | Admitting: Behavioral Health

## 2022-07-04 ENCOUNTER — Ambulatory Visit: Payer: No Typology Code available for payment source | Admitting: Behavioral Health

## 2022-07-04 ENCOUNTER — Encounter: Payer: Self-pay | Admitting: Behavioral Health

## 2022-07-04 ENCOUNTER — Other Ambulatory Visit: Payer: Self-pay

## 2022-07-04 DIAGNOSIS — F411 Generalized anxiety disorder: Secondary | ICD-10-CM

## 2022-07-04 DIAGNOSIS — F4310 Post-traumatic stress disorder, unspecified: Secondary | ICD-10-CM

## 2022-07-04 MED ORDER — GABAPENTIN 300 MG PO CAPS: 300.0000 mg | ORAL_CAPSULE | Freq: Four times a day (QID) | ORAL | 1 refills | Status: DC

## 2022-07-04 MED ORDER — DULOXETINE HCL 30 MG PO CPEP: 30.0000 mg | ORAL_CAPSULE | Freq: Two times a day (BID) | ORAL | 3 refills | Status: DC

## 2022-07-04 MED ORDER — ALPRAZOLAM 0.5 MG PO TABS: 0.5000 mg | ORAL_TABLET | Freq: Three times a day (TID) | ORAL | 2 refills | Status: DC | PRN

## 2022-07-04 NOTE — Progress Notes (Signed)
[facility]  Patient ID: Erik Olson,  MRN: [id number]  PCP: [facility]  Date of Evaluation: 07/04/2022 Time spent:30 minutes  Chief Complaint:  Chief Complaint   Depression; Anxiety; Follow-up; Patient Education; Medication Refill     HISTORY/CURRENT STATUS: HPI "Erik Olson", 62 year old male presents to this office via video visit for follow up and medication refill. Says he has been doing well on current medications. He continue to follow up with his specialis on regular basis. He is requesting no changes at this time  He says his anxiety today is 3/10 and depression is 0/10. He is sleeping 6-7  hours per night. He agrees that changes to his medication regimen is not indicated at this time due to his current stability. He denies mania, no psychosis. No SI/HI.    Prior medication trial; Valium Hydroxyzine Gabapentin Individual Medical History/ Review of Systems: Changes? :No   Allergies: Penicillins  Current Medications:  Current Outpatient Medications:    acetaminophen (TYLENOL) 325 MG tablet, Take by mouth., Disp: , Rfl:    ALPRAZolam (XANAX) 0.5 MG tablet, Take 1 tablet (0.5 mg total) by mouth 3 (three) times daily as needed for anxiety., Disp: 90 tablet, Rfl: 0   Cholecalciferol 25 MCG (1000 UT) capsule, Take by mouth., Disp: , Rfl:    esomeprazole (NEXIUM) 40 MG capsule, Take 1 capsule (40 mg total) by mouth daily. (Patient not taking: Reported on 11/07/2020), Disp: 30 capsule, Rfl: 0   ferrous sulfate 325 (65 FE) MG tablet, Take by mouth. (Patient not taking: Reported on 11/07/2020), Disp: , Rfl:    gabapentin (NEURONTIN) 300 MG capsule, Take 1 capsule (300 mg total) by mouth 4 (four) times daily., Disp: 120 capsule, Rfl: 1   HYDROcodone-acetaminophen (NORCO) 5-325 MG tablet, Take 1-2 tablets by mouth every 6 (six) hours as needed. (Patient not taking: Reported on 11/07/2020), Disp: 10 tablet, Rfl: 0   oxyCODONE-acetaminophen (PERCOCET/ROXICET) 5-325 MG  tablet, Take 1 tablet by mouth every 6 (six) hours as needed. (Patient not taking: Reported on 11/07/2020), Disp: 10 tablet, Rfl: 0   polyethylene glycol powder (GLYCOLAX/MIRALAX) 17 GM/SCOOP powder, TAKE 17 GRAMS BY MOUTH DAILY (MIX WITH WATER OR OTHER LIQUID AS INSTRUCTED BEFORE DRINKING), Disp: , Rfl:    predniSONE (DELTASONE) 10 MG tablet, Take 1 tablet by mouth daily., Disp: , Rfl:    pregabalin (LYRICA) 50 MG capsule, Take by mouth. (Patient not taking: Reported on 11/07/2020), Disp: , Rfl:    ranitidine (ZANTAC) 150 MG tablet, Take 150 mg by mouth daily. (Patient not taking: Reported on 11/07/2020), Disp: , Rfl:  Medication Side Effects: none  Family Medical/ Social History: Changes? No  MENTAL HEALTH EXAM:  There were no vitals taken for this visit.There is no height or weight on file to calculate BMI.  General Appearance: Casual, Neat, and Well Groomed  Eye Contact:  Good  Speech:  Clear and Coherent  Volume:  Normal  Mood:  NA  Affect:  Appropriate  Thought Process:  Coherent  Orientation:  Full (Time, Place, and Person)  Thought Content: Logical   Suicidal Thoughts:  No  Homicidal Thoughts:  No  Memory:  WNL  Judgement:  Good  Insight:  Good  Psychomotor Activity:  Normal  Concentration:  Concentration: Good  Recall:  Good  Fund of Knowledge: Good  Language: Good  Assets:  Desire for Improvement  ADL's:  Intact  Cognition: WNL  Prognosis:  Good    DIAGNOSES:    ICD-10-CM  1. Generalized anxiety disorder  F41.1 gabapentin (NEURONTIN) 300 MG capsule    DISCONTINUED: DULoxetine (CYMBALTA) 30 MG capsule    2. PTSD (post-traumatic stress disorder)  F43.10 gabapentin (NEURONTIN) 300 MG capsule    DISCONTINUED: DULoxetine (CYMBALTA) 30 MG capsule      Receiving Psychotherapy: No    RECOMMENDATIONS:   Continue with Cymbalta 30 mg twice daily Continue Xanax 0.5 mg three time daily as needed. Will reduce back to two per day when stressors are reduced.  Continue  Gabapentin  300 mg four times daily. Will report worsening symptoms promptly To follow up in 3 months to reasess Provided emergency contact information and after hours number. Provided 988 number for Suicide Crisis Hotline.   Greater than 50% of  30 min video visit time with patient was spent on counseling and coordination of care. We discussed his continued current level of stability. He continues to experience some increased levels of stress due to family dynamics.   Patient is very candid and cautious with his benzo use. Not concerned with additional risk at this time.  Discussed potential benefits, risk, and side effects of benzodiazepines to include potential risk of tolerance and dependence, as well as possible drowsiness.  Advised patient not to drive if experiencing drowsiness and to take lowest possible effective dose to minimize risk of dependence and tolerance. Patient agrees to follow up regularly to monitor progress. It is my professional opinion that this individual poses low risk for harm by continuing xanax at this time. No changes to medications at this time indicated.   PDMP reviewed  Joan Flores, NP

## 2022-07-10 ENCOUNTER — Ambulatory Visit: Payer: No Typology Code available for payment source | Admitting: Behavioral Health

## 2022-10-03 ENCOUNTER — Ambulatory Visit: Payer: No Typology Code available for payment source | Admitting: Behavioral Health

## 2022-10-14 ENCOUNTER — Telehealth: Payer: No Typology Code available for payment source | Admitting: Behavioral Health

## 2022-10-14 ENCOUNTER — Telehealth: Payer: Self-pay | Admitting: Behavioral Health

## 2022-10-14 ENCOUNTER — Other Ambulatory Visit: Payer: Self-pay | Admitting: Behavioral Health

## 2022-10-14 DIAGNOSIS — F411 Generalized anxiety disorder: Secondary | ICD-10-CM

## 2022-10-14 DIAGNOSIS — F4310 Post-traumatic stress disorder, unspecified: Secondary | ICD-10-CM

## 2022-10-14 MED ORDER — GABAPENTIN 300 MG PO CAPS: 300.0000 mg | ORAL_CAPSULE | Freq: Four times a day (QID) | ORAL | 0 refills | Status: DC

## 2022-10-14 MED ORDER — ALPRAZOLAM 0.5 MG PO TABS
0.5000 mg | ORAL_TABLET | Freq: Three times a day (TID) | ORAL | 3 refills | Status: DC | PRN
Start: 2022-10-14 — End: 2023-01-30

## 2022-10-14 NOTE — Telephone Encounter (Signed)
Pt requesting 1 mo Gabapentin to [facility] and Rx for all to [place] as well.

## 2022-10-20 ENCOUNTER — Telehealth: Payer: Self-pay | Admitting: Behavioral Health

## 2022-10-20 DIAGNOSIS — F4310 Post-traumatic stress disorder, unspecified: Secondary | ICD-10-CM

## 2022-10-20 DIAGNOSIS — F411 Generalized anxiety disorder: Secondary | ICD-10-CM

## 2022-10-20 MED ORDER — GABAPENTIN 300 MG PO CAPS
300.0000 mg | ORAL_CAPSULE | Freq: Four times a day (QID) | ORAL | 0 refills | Status: DC
Start: 2022-10-20 — End: 2022-10-30

## 2022-10-20 NOTE — Telephone Encounter (Signed)
Sent gabapentin to [place].

## 2022-10-20 NOTE — Telephone Encounter (Signed)
Pt call requesting Gabapentin Rx for August to [facility].  July [facility] to local [facility] requested[facility] just need Gab[facility]entin.

## 2022-10-30 ENCOUNTER — Ambulatory Visit: Payer: No Typology Code available for payment source | Admitting: Behavioral Health

## 2022-10-30 ENCOUNTER — Encounter: Payer: Self-pay | Admitting: Behavioral Health

## 2022-10-30 DIAGNOSIS — F4310 Post-traumatic stress disorder, unspecified: Secondary | ICD-10-CM | POA: Diagnosis not present

## 2022-10-30 DIAGNOSIS — F411 Generalized anxiety disorder: Secondary | ICD-10-CM | POA: Diagnosis not present

## 2022-10-30 MED ORDER — GABAPENTIN 300 MG PO CAPS
300.0000 mg | ORAL_CAPSULE | Freq: Four times a day (QID) | ORAL | 2 refills | Status: DC
Start: 2022-10-30 — End: 2023-01-30

## 2022-10-30 NOTE — Progress Notes (Signed)
[facility]  Patient ID: TOKUO ABAJIAN,  MRN: [id number]  [facility]P: [facility]  Date of Evaluation: 10/30/2022 Time spent:30 minutes  Chief Complaint:  Chief Complaint   Anxiety; Follow-up; Patient Education; Trauma; Medication Refill     HISTORY/CURRENT STATUS: HPI "Don", 62 year old male presents to this office via video visit for follow up and medication refill. Says he has been doing well on current medications. We talked about his recent episodes with severe pain. He continue to receive therapy at [facility]. He continue to follow up with his specialis on regular basis. He is requesting no changes at this time  He says his anxiety today is 3/10 and depression is 0/10. He is sleeping 6-7  hours per night. He agrees that changes to his medication regimen is not indicated at this time due to his current stability. He denies mania, no psychosis. No SI/HI.    Prior medication trial; Valium Hydroxyzine Gabapentin    Individual Medical History/ Review of Systems: Changes? :No   Allergies: Penicillins  Current Medications:  Current Outpatient Medications:    acetaminophen (TYLENOL) 325 MG tablet, Take by mouth., Disp: , Rfl:    ALPRAZolam (XANAX) 0.5 MG tablet, Take 1 tablet (0.5 mg total) by mouth 3 (three) times daily as needed for anxiety., Disp: 90 tablet, Rfl: 3   Cholecalciferol 25 MCG (1000 UT) capsule, Take by mouth., Disp: , Rfl:    gabapentin (NEURONTIN) 300 MG capsule, Take 1 capsule (300 mg total) by mouth 4 (four) times daily., Disp: 120 capsule, Rfl: 2   polyethylene glycol powder (GLYCOLAX/MIRALAX) 17 GM/SCOOP powder, TAKE 17 GRAMS BY MOUTH DAILY (MIX WITH WATER OR OTHER LIQUID AS INSTRUCTED BEFORE DRINKING), Disp: , Rfl:    predniSONE (DELTASONE) 10 MG tablet, Take 1 tablet by mouth daily., Disp: , Rfl:  Medication Side Effects: none  Family Medical/ Social History: Changes? No  MENTAL HEALTH EXAM:  There were no vitals taken for this  visit.There is no height or weight on file to calculate BMI.  General Appearance: Casual, Neat, and Well Groomed  Eye Contact:  Good  Speech:  Clear and Coherent  Volume:  Normal  Mood:  NA  Affect:  Appropriate  Thought Process:  Coherent  Orientation:  Full (Time, Place, and Person)  Thought Content: Logical   Suicidal Thoughts:  No  Homicidal Thoughts:  No  Memory:  WNL  Judgement:  Good  Insight:  Good  Psychomotor Activity:  Normal  Concentration:  Concentration: Good  Recall:  Good  Fund of Knowledge: Good  Language: Good  Assets:  Desire for Improvement  ADL's:  Intact  Cognition: WNL  Prognosis:  Good    DIAGNOSES:    ICD-10-CM   1. Generalized anxiety disorder  F41.1 gabapentin (NEURONTIN) 300 MG capsule    2. PTSD (post-traumatic stress disorder)  F43.10 gabapentin (NEURONTIN) 300 MG capsule      Receiving Psychotherapy: No    RECOMMENDATIONS:   Continue with Cymbalta 30 mg twice daily Continue Xanax 0.5 mg three time daily as needed. Will reduce back to two per day when stressors are reduced.  Continue Gabapentin  300 mg four times daily. Will report worsening symptoms promptly To follow up in 3 months to reasess Provided emergency contact information and after hours number. Provided 988 number for Suicide Crisis Hotline.   Greater than 50% of  30 min video visit time with patient was spent on counseling and coordination of care. No changes this visit.  We discussed his continued current level of stability. He continues to experience some increased levels of stress due to family dynamics.   Patient is very candid and cautious with his benzo use. Not concerned with additional risk at this time.  Discussed potential benefits, risk, and side effects of benzodiazepines to include potential risk of tolerance and dependence, as well as possible drowsiness.  Advised patient not to drive if experiencing drowsiness and to take lowest possible effective dose to  minimize risk of dependence and tolerance. Patient agrees to follow up regularly to monitor progress. It is my professional opinion that this individual poses low risk for harm by continuing xanax at this time. No changes to medications at this time indicated.   PDMP reviewed     Joan Flores, NP

## 2023-01-30 ENCOUNTER — Ambulatory Visit: Payer: No Typology Code available for payment source | Admitting: Behavioral Health

## 2023-01-30 ENCOUNTER — Encounter: Payer: Self-pay | Admitting: Behavioral Health

## 2023-01-30 DIAGNOSIS — F411 Generalized anxiety disorder: Secondary | ICD-10-CM | POA: Diagnosis not present

## 2023-01-30 DIAGNOSIS — F4310 Post-traumatic stress disorder, unspecified: Secondary | ICD-10-CM

## 2023-01-30 MED ORDER — GABAPENTIN 300 MG PO CAPS
300.0000 mg | ORAL_CAPSULE | Freq: Four times a day (QID) | ORAL | 5 refills | Status: DC
Start: 2023-01-30 — End: 2023-05-20

## 2023-01-30 MED ORDER — ALPRAZOLAM 0.5 MG PO TABS
0.5000 mg | ORAL_TABLET | Freq: Three times a day (TID) | ORAL | 3 refills | Status: DC | PRN
Start: 2023-01-30 — End: 2023-05-20

## 2023-01-30 NOTE — Progress Notes (Signed)
[facility]  Patient ID: DAMEYON WIXSON,  MRN: [id number]  [facility]  Date of Evaluation: 01/30/2023 Time spent:30 minutes  Chief Complaint:   HISTORY/CURRENT STATUS: HPI  "Don", 62 year old male presents to this office via video visit for follow up and medication refill. Been very busy with work and he is very tired. Father is now in hospice and big adjustment for everyone.  He continues to follow up with his specialist on regular basis. He is requesting no changes at this time  He says his anxiety today is 3/10 and depression is 0/10. He is sleeping 6-7  hours per night. He agrees that changes to his medication regimen is not indicated at this time due to his current stability. He denies mania, no psychosis. No SI/HI.    Prior medication trial; Valium Hydroxyzine Gabapentin  Individual Medical History/ Review of Systems: Changes? :No   Allergies: Penicillins  Current Medications:  Current Outpatient Medications:    acetaminophen (TYLENOL) 325 MG tablet, Take by mouth., Disp: , Rfl:    ALPRAZolam (XANAX) 0.5 MG tablet, Take 1 tablet (0.5 mg total) by mouth 3 (three) times daily as needed for anxiety., Disp: 90 tablet, Rfl: 3   Cholecalciferol 25 MCG (1000 UT) capsule, Take by mouth., Disp: , Rfl:    gabapentin (NEURONTIN) 300 MG capsule, Take 1 capsule (300 mg total) by mouth 4 (four) times daily., Disp: 120 capsule, Rfl: 5   polyethylene glycol powder (GLYCOLAX/MIRALAX) 17 GM/SCOOP powder, TAKE 17 GRAMS BY MOUTH DAILY (MIX WITH WATER OR OTHER LIQUID AS INSTRUCTED BEFORE DRINKING), Disp: , Rfl:    predniSONE (DELTASONE) 10 MG tablet, Take 1 tablet by mouth daily., Disp: , Rfl:  Medication Side Effects: none  Family Medical/ Social History: Changes? No  MENTAL HEALTH EXAM:  There were no vitals taken for this visit.There is no height or weight on file to calculate BMI.  General Appearance: Casual, Neat, and Well Groomed  Eye Contact:  Good   Speech:  Clear and Coherent  Volume:  Normal  Mood:  NA  Affect:  Appropriate  Thought Process:  Coherent  Orientation:  Full (Time, Place, and Person)  Thought Content: Logical   Suicidal Thoughts:  No  Homicidal Thoughts:  No  Memory:  WNL  Judgement:  Good  Insight:  Good  Psychomotor Activity:  Normal  Concentration:  Concentration: Good  Recall:  Good  Fund of Knowledge: Good  Language: Good  Assets:  Desire for Improvement  ADL's:  Intact  Cognition: WNL  Prognosis:  Good    DIAGNOSES:    ICD-10-CM   1. Generalized anxiety disorder  F41.1 ALPRAZolam (XANAX) 0.5 MG tablet    gabapentin (NEURONTIN) 300 MG capsule    2. PTSD (post-traumatic stress disorder)  F43.10 ALPRAZolam (XANAX) 0.5 MG tablet    gabapentin (NEURONTIN) 300 MG capsule      Receiving Psychotherapy: No    RECOMMENDATIONS:   Continue with Gabapentin 300 mg four times daily as needed. Continue Xanax 0.5 mg three time daily as needed. Will reduce back to two per day when stressors are reduced.  Continue Gabapentin  300 mg four times daily. Will report worsening symptoms promptly To follow up in 3 months to reasess Provided emergency contact information and after hours number. Provided 988 number for Suicide Crisis Hotline.   Greater than 50% of  30 min video visit time with patient was spent on counseling and coordination of care. No changes this visit.  We discussed his continued current level of stability. He continues to experience some increased levels of stress due to family dynamics.   Patient is very candid and cautious with his benzo use. Not concerned with additional risk at this time.  Discussed potential benefits, risk, and side effects of benzodiazepines to include potential risk of tolerance and dependence, as well as possible drowsiness.  Advised patient not to drive if experiencing drowsiness and to take lowest possible effective dose to minimize risk of dependence and tolerance.  Patient agrees to follow up regularly to monitor progress. It is my professional opinion that this individual poses low risk for harm by continuing xanax at this time. No changes to medications at this time indicated.   PDMP reviewed    Joan Flores, NP

## 2023-05-01 ENCOUNTER — Ambulatory Visit: Payer: No Typology Code available for payment source | Admitting: Behavioral Health

## 2023-05-20 ENCOUNTER — Encounter: Payer: Self-pay | Admitting: Behavioral Health

## 2023-05-20 ENCOUNTER — Ambulatory Visit (INDEPENDENT_AMBULATORY_CARE_PROVIDER_SITE_OTHER): Payer: No Typology Code available for payment source | Admitting: Behavioral Health

## 2023-05-20 DIAGNOSIS — F411 Generalized anxiety disorder: Secondary | ICD-10-CM

## 2023-05-20 DIAGNOSIS — F4310 Post-traumatic stress disorder, unspecified: Secondary | ICD-10-CM | POA: Diagnosis not present

## 2023-05-20 MED ORDER — ALPRAZOLAM 0.5 MG PO TABS
0.5000 mg | ORAL_TABLET | Freq: Three times a day (TID) | ORAL | 3 refills | Status: DC | PRN
Start: 2023-05-20 — End: 2023-08-05

## 2023-05-20 MED ORDER — GABAPENTIN 300 MG PO CAPS
300.0000 mg | ORAL_CAPSULE | Freq: Four times a day (QID) | ORAL | 5 refills | Status: DC
Start: 2023-05-20 — End: 2023-08-05

## 2023-05-20 NOTE — Progress Notes (Signed)
 [facility]  Patient ID: FLORENCIO HOLLIBAUGH,  MRN: [id number]  [facility]P: [facility]  Date of Evaluation: 05/20/2023 Time spent:30 minutes  Chief Complaint:  Chief Complaint   Anxiety; Trauma; Medication Refill; Patient Education; Follow-up     HISTORY/CURRENT STATUS: HPI "Don", 63 year old male presents to this office visit for follow up and medication refill. Father passed in November and family adjusting well. He is enjoying a good period of stability. He and his wife are planning vacation.   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 He is requesting no changes at this time  He says his anxiety today is 2/10 and depression is 0/10. He is sleeping 6-7  hours per night. He agrees that changes to his medication regimen is not indicated at this time due to his current stability. He denies mania, no psychosis. No SI/HI.    Prior medication trial; Valium Hydroxyzine Gabapentin     Individual Medical History/ Review of Systems: Changes? :No   Allergies: Penicillins  Current Medications:  Current Outpatient Medications:    acetaminophen (TYLENOL) 325 MG tablet, Take by mouth., Disp: , Rfl:    ALPRAZolam (XANAX) 0.5 MG tablet, Take 1 tablet (0.5 mg total) by mouth 3 (three) times daily as needed for anxiety., Disp: 90 tablet, Rfl: 3   Cholecalciferol 25 MCG (1000 UT) capsule, Take by mouth., Disp: , Rfl:    gabapentin (NEURONTIN) 300 MG capsule, Take 1 capsule (300 mg total) by mouth 4 (four) times daily., Disp: 120 capsule, Rfl: 5   polyethylene glycol powder (GLYCOLAX/MIRALAX) 17 GM/SCOOP powder, TAKE 17 GRAMS BY MOUTH DAILY (MIX WITH WATER OR OTHER LIQUID AS INSTRUCTED BEFORE DRINKING), Disp: , Rfl:    predniSONE (DELTASONE) 10 MG tablet, Take 1 tablet by mouth daily., Disp: , Rfl:  Medication Side Effects: none  Family Medical/ Social History: Changes? No  MENTAL HEALTH EXAM:  There were no vitals  taken for this visit.There is no height or weight on file to calculate BMI.  General Appearance: Casual, Neat, and Well Groomed  Eye Contact:  Good  Speech:  Clear and Coherent  Volume:  Normal  Mood:  NA  Affect:  Appropriate  Thought Process:  Coherent  Orientation:  Full (Time, Place, and Person)  Thought Content: Logical   Suicidal Thoughts:  No  Homicidal Thoughts:  No  Memory:  WNL  Judgement:  Good  Insight:  Good  Psychomotor Activity:  Normal  Concentration:  Concentration: Good  Recall:  Good  Fund of Knowledge: Good  Language: Good  Assets:  Desire for Improvement  ADL's:  Intact  Cognition: WNL  Prognosis:  Good    DIAGNOSES:    ICD-10-CM   1. Generalized anxiety disorder  F41.1 ALPRAZolam (XANAX) 0.5 MG tablet    gabapentin (NEURONTIN) 300 MG capsule    2. PTSD (post-traumatic stress disorder)  F43.10 ALPRAZolam (XANAX) 0.5 MG tablet    gabapentin (NEURONTIN) 300 MG capsule      Receiving Psychotherapy: No    RECOMMENDATIONS:   Continue with Gabapentin 300 mg four times daily as needed. Continue Xanax 0.5 mg three time daily as needed. Will reduce back to two per day when stressors are reduced.  Continue Gabapentin  300 mg four times daily. Will report worsening symptoms promptly To follow up in 3 months to reasess Provided emergency contact information and after hours number. Provided 988 number for Suicide Crisis Hotline.   Greater than  50% of  30 min face to face  with patient was spent on counseling and coordination of care. No changes this visit.  We discussed his continued current level of stability. Talked about family dynamics since passing of his father. Adjusting well. Patient is very candid and cautious with his benzo use. Not concerned with additional risk at this time.  Discussed potential benefits, risk, and side effects of benzodiazepines to include potential risk of tolerance and dependence, as well as possible drowsiness.  Advised patient  not to drive if experiencing drowsiness and to take lowest possible effective dose to minimize risk of dependence and tolerance. Patient agrees to follow up regularly to monitor progress. It is my professional opinion that this individual poses low risk for harm by continuing xanax at this time. No changes to medications at this time indicated.   PDMP reviewed      Joan Flores, NP

## 2023-08-05 ENCOUNTER — Encounter: Payer: Self-pay | Admitting: Behavioral Health

## 2023-08-05 ENCOUNTER — Ambulatory Visit: Admitting: Behavioral Health

## 2023-08-05 DIAGNOSIS — F411 Generalized anxiety disorder: Secondary | ICD-10-CM

## 2023-08-05 DIAGNOSIS — F4310 Post-traumatic stress disorder, unspecified: Secondary | ICD-10-CM

## 2023-08-05 MED ORDER — GABAPENTIN 300 MG PO CAPS
300.0000 mg | ORAL_CAPSULE | Freq: Four times a day (QID) | ORAL | 5 refills | Status: DC
Start: 2023-08-05 — End: 2023-11-05

## 2023-08-05 MED ORDER — ALPRAZOLAM 0.5 MG PO TABS
0.5000 mg | ORAL_TABLET | Freq: Three times a day (TID) | ORAL | 3 refills | Status: DC | PRN
Start: 2023-08-05 — End: 2023-11-05

## 2023-08-05 NOTE — Progress Notes (Signed)
 [facility]  Patient ID: Erik Olson,  MRN: [id number]  [facility]  Date of Evaluation: 08/05/2023 Time spent:30 minutes  Chief Complaint:  Chief Complaint   Depression; Anxiety; Follow-up; Medication Refill; Patient Education     HISTORY/CURRENT STATUS: HPI  "Erik Olson", 63 year old male presents to this office visit for follow up and medication refill. Family adjusting after fathers passing. He will be retiring officially in August of this year. 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 He is requesting no changes at this time  He says his anxiety today is 2/10 and depression is 0/10. He is sleeping 6-7  hours per night. He agrees that changes to his medication regimen is not indicated at this time due to his current stability. He denies mania, no psychosis. No SI/HI.    Prior medication trial; Valium Hydroxyzine Gabapentin     Individual Medical History/ Review of Systems: Changes? :No   Allergies: Penicillins  Current Medications:  Current Outpatient Medications:    acetaminophen  (TYLENOL ) 325 MG tablet, Take by mouth., Disp: , Rfl:    ALPRAZolam  (XANAX ) 0.5 MG tablet, Take 1 tablet (0.5 mg total) by mouth 3 (three) times daily as needed for anxiety., Disp: 90 tablet, Rfl: 3   Cholecalciferol 25 MCG (1000 UT) capsule, Take by mouth., Disp: , Rfl:    gabapentin  (NEURONTIN ) 300 MG capsule, Take 1 capsule (300 mg total) by mouth 4 (four) times daily., Disp: 120 capsule, Rfl: 5   polyethylene glycol powder (GLYCOLAX/MIRALAX) 17 GM/SCOOP powder, TAKE 17 GRAMS BY MOUTH DAILY (MIX WITH WATER OR OTHER LIQUID AS INSTRUCTED BEFORE DRINKING), Disp: , Rfl:    predniSONE (DELTASONE) 10 MG tablet, Take 1 tablet by mouth daily., Disp: , Rfl:  Medication Side Effects: none  Family Medical/ Social History: Changes? No  MENTAL HEALTH EXAM:  There were no vitals taken for this visit.There is no height  or weight on file to calculate BMI.  General Appearance: Casual, Neat, and Well Groomed  Eye Contact:  Good  Speech:  Clear and Coherent  Volume:  Normal  Mood:  NA  Affect:  Appropriate  Thought Process:  Coherent  Orientation:  Full (Time, Place, and Person)  Thought Content: Logical   Suicidal Thoughts:  No  Homicidal Thoughts:  No  Memory:  WNL  Judgement:  Good  Insight:  Good  Psychomotor Activity:  Normal  Concentration:  Concentration: Good  Recall:  Good  Fund of Knowledge: Good  Language: Good  Assets:  Desire for Improvement  ADL's:  Intact  Cognition: WNL  Prognosis:  Good    DIAGNOSES:    ICD-10-CM   1. Generalized anxiety disorder  F41.1 gabapentin  (NEURONTIN ) 300 MG capsule    ALPRAZolam  (XANAX ) 0.5 MG tablet    2. PTSD (post-traumatic stress disorder)  F43.10 gabapentin  (NEURONTIN ) 300 MG capsule    ALPRAZolam  (XANAX ) 0.5 MG tablet      Receiving Psychotherapy: No    RECOMMENDATIONS:   Continue with Gabapentin  300 mg four times daily as needed. Continue Xanax  0.5 mg three time daily as needed. Will reduce back to two per day when stressors are reduced.  Continue Gabapentin   300 mg four times daily. Will report worsening symptoms promptly To follow up in 3 months to reasess Provided emergency contact information and after hours number. Provided 988 number for Suicide Crisis Hotline.   Greater than 50% of  30 min face to face  with  patient was spent on counseling and coordination of care. No changes this visit.  We discussed his continued current level of stability. Talked about his pending official retirement from any [employer] duty. Adjusting well. Patient is very candid and cautious with his benzo use. Not concerned with additional risk at this time.  Discussed potential benefits, risk, and side effects of benzodiazepines to include potential risk of tolerance and dependence, as well as possible drowsiness.  Advised patient not to drive if experiencing  drowsiness and to take lowest possible effective dose to minimize risk of dependence and tolerance. Patient agrees to follow up regularly to monitor progress. It is my professional opinion that this individual poses low risk for harm by continuing xanax  at this time. No changes to medications at this time indicated.   PDMP reviewed    Lincoln Renshaw, NP

## 2023-11-05 ENCOUNTER — Ambulatory Visit: Admitting: Behavioral Health

## 2023-11-05 ENCOUNTER — Encounter: Payer: Self-pay | Admitting: Behavioral Health

## 2023-11-05 DIAGNOSIS — F411 Generalized anxiety disorder: Secondary | ICD-10-CM | POA: Diagnosis not present

## 2023-11-05 DIAGNOSIS — F99 Mental disorder, not otherwise specified: Secondary | ICD-10-CM

## 2023-11-05 DIAGNOSIS — F5105 Insomnia due to other mental disorder: Secondary | ICD-10-CM

## 2023-11-05 DIAGNOSIS — F4310 Post-traumatic stress disorder, unspecified: Secondary | ICD-10-CM | POA: Diagnosis not present

## 2023-11-05 DIAGNOSIS — G4709 Other insomnia: Secondary | ICD-10-CM | POA: Diagnosis not present

## 2023-11-05 MED ORDER — DAYVIGO 10 MG PO TABS
10.0000 mg | ORAL_TABLET | Freq: Every evening | ORAL | 1 refills | Status: DC
Start: 2023-11-05 — End: 2024-02-05

## 2023-11-05 MED ORDER — ALPRAZOLAM 0.5 MG PO TABS
0.5000 mg | ORAL_TABLET | Freq: Three times a day (TID) | ORAL | 3 refills | Status: DC | PRN
Start: 2023-11-05 — End: 2024-02-05

## 2023-11-05 MED ORDER — GABAPENTIN 300 MG PO CAPS: 300.0000 mg | ORAL_CAPSULE | Freq: Four times a day (QID) | ORAL | 5 refills | Status: DC

## 2023-11-05 NOTE — Progress Notes (Signed)
 [facility]  Patient ID: DIALLO PONDER,  MRN: [id number]  PCP: [facility]  Date of Evaluation: 11/05/2023 Time spent:30 minutes  Chief Complaint:  Chief Complaint   Depression; Anxiety; Follow-up; Medication Refill; Patient Education     HISTORY/CURRENT STATUS: HPI [name], 63 year old male presents to this office visit for follow up and medication refill. Was hoping to fully retire this August but says he has a few pending assignment left with the [employer]. Not sure what date it will be now.  He understands that after his work is finished, he will need to remain active with new hobbies. He continues to stay fit by working out and martial arts. He is concerned about his sleeping patterns and is requesting medication that may help restore sleep routine.  He does not want to take hypnotic such as Ambien or Lunesta.  He says his anxiety today is 2/10 and depression is 0/10. He is sleeping 5-6   hours of broken per night. He denies mania, no psychosis. No SI/HI.    Prior medication trial; Valium Hydroxyzine Gabapentin Ambien  Individual Medical History/ Review of Systems: Changes? :No   Allergies: Penicillins  Current Medications:  Current Outpatient Medications:    Lemborexant (DAYVIGO) 10 MG TABS, Take 1 tablet (10 mg total) by mouth at bedtime., Disp: 30 tablet, Rfl: 1   acetaminophen (TYLENOL) 325 MG tablet, Take by mouth., Disp: , Rfl:    ALPRAZolam (XANAX) 0.5 MG tablet, Take 1 tablet (0.5 mg total) by mouth 3 (three) times daily as needed for anxiety., Disp: 90 tablet, Rfl: 3   Cholecalciferol 25 MCG (1000 UT) capsule, Take by mouth., Disp: , Rfl:    gabapentin (NEURONTIN) 300 MG capsule, Take 1 capsule (300 mg total) by mouth 4 (four) times daily., Disp: 120 capsule, Rfl: 5   polyethylene glycol powder (GLYCOLAX/MIRALAX) 17 GM/SCOOP powder, TAKE 17 GRAMS BY MOUTH DAILY (MIX WITH WATER OR OTHER LIQUID AS INSTRUCTED BEFORE DRINKING), Disp: , Rfl:     predniSONE (DELTASONE) 10 MG tablet, Take 1 tablet by mouth daily., Disp: , Rfl:  Medication Side Effects: none  Family Medical/ Social History: Changes? No  MENTAL HEALTH EXAM:  There were no vitals taken for this visit.There is no height or weight on file to calculate BMI.  General Appearance: Casual, Neat, and Well Groomed  Eye Contact:  Good  Speech:  Clear and Coherent  Volume:  Normal  Mood:  NA  Affect:  Appropriate  Thought Process:  Coherent  Orientation:  Full (Time, Place, and Person)  Thought Content: Logical   Suicidal Thoughts:  No  Homicidal Thoughts:  No  Memory:  WNL  Judgement:  Good  Insight:  Good  Psychomotor Activity:  Normal  Concentration:  Concentration: Good  Recall:  Good  Fund of Knowledge: Good  Language: Good  Assets:  Desire for Improvement  ADL's:  Intact  Cognition: WNL  Prognosis:  Good    DIAGNOSES:    ICD-10-CM   1. 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 Generalized anxiety disorder  F41.1 ALPRAZolam (XANAX) 0.5 MG tablet    gabapentin (NEURONTIN) 300 MG capsule    4. PTSD (post-traumatic stress disorder)  F43.10 ALPRAZolam (XANAX) 0.5 MG tablet    gabapentin (NEURONTIN) 300 MG capsule      Receiving Psychotherapy: No    RECOMMENDATIONS:  To start Dayvigo 10 mg daily at bedtime for insomnia. For best efficacy continue for one week after starting to see if medication will be helpful before stopping.   Continue with Gabapentin 300 mg four times daily as needed. Continue Xanax 0.5 mg three time daily as needed. Will reduce back to two per day when stressors are reduced.  Continue Gabapentin  300 mg four times daily. Will report worsening symptoms promptly To follow up in 3 months to reasess Provided emergency contact information and after hours number. Provided 988 number for Suicide Crisis Hotline.   Greater than 50% of  30 min face to  face  with patient was spent on counseling and coordination of care. Discussed concerns of poor sleep/wake cycle in period of high stress. Requesting assistance with new medication but prefers to stay away from hypnotics Ambien and Lunesta. Due to job requirements need to be able to be more alert if awakened during the night. Dayvigo will allow him to do this with less risk.   Discussed potential benefits, risk, and side effects of benzodiazepines to include potential risk of tolerance and dependence, as well as possible drowsiness.  Advised patient not to drive if experiencing drowsiness and to take lowest possible effective dose to minimize risk of dependence and tolerance. Patient agrees to follow up regularly to monitor progress. It is my professional opinion that this individual poses low risk for harm by continuing xanax at this time. No changes to medications at this time indicated.   PDMP reviewed   Redell DELENA Pizza, NP

## 2023-11-17 ENCOUNTER — Telehealth: Payer: Self-pay

## 2023-11-17 NOTE — Telephone Encounter (Signed)
 Received fax from [facility] stating pt must try and fail 2 formularies prior to submitting a PA for Dayvigo , in addition he also must have this prescribed and monitored by a [facility] Specialist, documented diagnosis of insomnia, and an adequate trial of Quviviq.   Will update Redell with the information.

## 2023-11-19 ENCOUNTER — Telehealth: Payer: Self-pay | Admitting: Behavioral Health

## 2023-11-19 NOTE — Telephone Encounter (Signed)
 Pt was told to call if he any problems with filling his Dayvigo  at the [facility]. He said they told him the particular way Redell needs to prescribe it.

## 2023-11-20 NOTE — Telephone Encounter (Signed)
 Discussed with Traci. Received fax from [place] with requirements.

## 2024-02-05 ENCOUNTER — Ambulatory Visit: Admitting: Behavioral Health

## 2024-02-05 ENCOUNTER — Encounter: Payer: Self-pay | Admitting: Behavioral Health

## 2024-02-05 DIAGNOSIS — F411 Generalized anxiety disorder: Secondary | ICD-10-CM | POA: Diagnosis not present

## 2024-02-05 DIAGNOSIS — F5105 Insomnia due to other mental disorder: Secondary | ICD-10-CM | POA: Diagnosis not present

## 2024-02-05 DIAGNOSIS — F99 Mental disorder, not otherwise specified: Secondary | ICD-10-CM

## 2024-02-05 DIAGNOSIS — F4310 Post-traumatic stress disorder, unspecified: Secondary | ICD-10-CM

## 2024-02-05 MED ORDER — ALPRAZOLAM 0.5 MG PO TABS: 0.5000 mg | ORAL_TABLET | Freq: Three times a day (TID) | ORAL | 3 refills | Status: DC | PRN

## 2024-02-05 MED ORDER — QUETIAPINE FUMARATE 50 MG PO TABS: 50.0000 mg | ORAL_TABLET | Freq: Every day | ORAL | 2 refills | Status: AC

## 2024-02-05 MED ORDER — GABAPENTIN 300 MG PO CAPS: 300.0000 mg | ORAL_CAPSULE | Freq: Four times a day (QID) | ORAL | 5 refills | Status: DC

## 2024-02-05 NOTE — Progress Notes (Signed)
 [facility]  Patient ID: Erik Olson,  MRN: [id number]  PCP: [facility]  Date of Evaluation: 02/05/2024 Time spent:30 minutes  Chief Complaint:   HISTORY/CURRENT STATUS: HPI Erik Olson, 63 year old male presents to this office visit for follow up and medication refill. Was hoping to fully retire this August but says he has a few pending assignment left with the [employer]. New retirement date may take place in December now.    Says that he has plans to stay busy after he retires. He continues to stay fit by working out and martial arts. He is concerned about his sleeping patterns and is requesting medication that may help restore sleep routine.  He does not want to take hypnotic such as Ambien or Lunesta.  He says his anxiety today is 2/10 and depression is 0/10. He is sleeping 5-6   hours of broken per night. He denies mania, no psychosis. No SI/HI.    Prior medication trial; Valium Hydroxyzine Gabapentin  Ambien    Individual Medical History/ Review of Systems: Changes? :No   Allergies: Penicillins  Current Medications:  Current Outpatient Medications:    QUEtiapine (SEROQUEL) 50 MG tablet, Take 1 tablet (50 mg total) by mouth at bedtime., Disp: 30 tablet, Rfl: 2   acetaminophen  (TYLENOL ) 325 MG tablet, Take by mouth., Disp: , Rfl:    ALPRAZolam  (XANAX ) 0.5 MG tablet, Take 1 tablet (0.5 mg total) by mouth 3 (three) times daily as needed for anxiety., Disp: 90 tablet, Rfl: 3   Cholecalciferol 25 MCG (1000 UT) capsule, Take by mouth., Disp: , Rfl:    gabapentin  (NEURONTIN ) 300 MG capsule, Take 1 capsule (300 mg total) by mouth 4 (four) times daily., Disp: 120 capsule, Rfl: 5   polyethylene glycol powder (GLYCOLAX/MIRALAX) 17 GM/SCOOP powder, TAKE 17 GRAMS BY MOUTH DAILY (MIX WITH WATER OR OTHER LIQUID AS INSTRUCTED BEFORE DRINKING), Disp: , Rfl:    predniSONE (DELTASONE) 10 MG tablet, Take 1 tablet by mouth daily., Disp: , Rfl:  Medication Side Effects:  none  Family Medical/ Social History: Changes? No  MENTAL HEALTH EXAM:  There were no vitals taken for this visit.There is no height or weight on file to calculate BMI.  General Appearance: Casual, Neat, and Well Groomed  Eye Contact:  Good  Speech:  Talkative  Volume:  Normal  Mood:  NA  Affect:  Appropriate  Thought Process:  Coherent  Orientation:  Full (Time, Place, and Person)  Thought Content: Logical   Suicidal Thoughts:  No  Homicidal Thoughts:  No  Memory:  WNL  Judgement:  Good  Insight:  Good  Psychomotor Activity:  Normal  Concentration:  Concentration: Good  Recall:  Good  Fund of Knowledge: Good  Language: Good  Assets:  Desire for Improvement  ADL's:  Intact  Cognition: WNL  Prognosis:  Good    DIAGNOSES:    ICD-10-CM   1. Insomnia due to other mental disorder  F51.05 QUEtiapine (SEROQUEL) 50 MG tablet   F99     2. Generalized anxiety disorder  F41.1 QUEtiapine (SEROQUEL) 50 MG tablet    ALPRAZolam  (XANAX ) 0.5 MG tablet    gabapentin  (NEURONTIN ) 300 MG capsule    3. PTSD (post-traumatic stress disorder)  F43.10 ALPRAZolam  (XANAX ) 0.5 MG tablet    gabapentin  (NEURONTIN ) 300 MG capsule      Receiving Psychotherapy: No    RECOMMENDATIONS:     To start Seroquel 25 mg-50 mg daily at bedtime.  Continue with Gabapentin  300 mg four  times daily as needed. Continue Xanax  0.5 mg three time daily as needed. Will reduce back to two per day when stressors are reduced.  Continue Gabapentin   300 mg four times daily. Will report worsening symptoms promptly To follow up in 3 months to reasess Provided emergency contact information and after hours number. Provided 988 number for Suicide Crisis Hotline.   Greater than 50% of  30 min face to face  with patient was spent on counseling and coordination of care. He continues with concerns of poor sleep/wake cycle in period of high stress. Dayvigo  was to expensive and was not able to start.  We discussed other  options that may be covered with [facility] that may be helpful without using hypnotics.   Discussed potential benefits, risk, and side effects of benzodiazepines to include potential risk of tolerance and dependence, as well as possible drowsiness.  Advised patient not to drive if experiencing drowsiness and to take lowest possible effective dose to minimize risk of dependence and tolerance. Patient agrees to follow up regularly to monitor progress. It is my professional opinion that this individual poses low risk for harm by continuing xanax  at this time. No changes to medications at this time indicated.   PDMP reviewed      Redell DELENA Pizza, NP

## 2024-04-06 ENCOUNTER — Other Ambulatory Visit: Payer: Self-pay

## 2024-04-06 ENCOUNTER — Telehealth: Payer: Self-pay | Admitting: Behavioral Health

## 2024-04-06 DIAGNOSIS — F411 Generalized anxiety disorder: Secondary | ICD-10-CM

## 2024-04-06 DIAGNOSIS — F4310 Post-traumatic stress disorder, unspecified: Secondary | ICD-10-CM

## 2024-04-06 MED ORDER — ALPRAZOLAM 0.5 MG PO TABS: 0.5000 mg | ORAL_TABLET | Freq: Three times a day (TID) | ORAL | 0 refills | Status: DC | PRN

## 2024-04-06 NOTE — Telephone Encounter (Signed)
 Pt LVM @ 10:30a requesting refill of Xanax .  He said he normally get it at the [facility], but it takes 7-10 days so he would like it sent to [facility] [place].  Next appt 1/20.

## 2024-04-06 NOTE — Telephone Encounter (Signed)
 Pended

## 2024-04-12 ENCOUNTER — Ambulatory Visit: Admitting: Behavioral Health

## 2024-04-22 ENCOUNTER — Encounter: Payer: Self-pay | Admitting: Behavioral Health

## 2024-04-22 ENCOUNTER — Ambulatory Visit (INDEPENDENT_AMBULATORY_CARE_PROVIDER_SITE_OTHER): Admitting: Behavioral Health

## 2024-04-22 ENCOUNTER — Other Ambulatory Visit: Payer: Self-pay

## 2024-04-22 DIAGNOSIS — F411 Generalized anxiety disorder: Secondary | ICD-10-CM | POA: Diagnosis not present

## 2024-04-22 DIAGNOSIS — F4310 Post-traumatic stress disorder, unspecified: Secondary | ICD-10-CM

## 2024-04-22 MED ORDER — ALPRAZOLAM 0.5 MG PO TABS: 0.5000 mg | ORAL_TABLET | Freq: Three times a day (TID) | ORAL | 0 refills | Status: DC | PRN

## 2024-04-22 MED ORDER — ALPRAZOLAM 0.5 MG PO TABS: 0.5000 mg | ORAL_TABLET | Freq: Three times a day (TID) | ORAL | 0 refills | Status: AC | PRN

## 2024-04-22 MED ORDER — GABAPENTIN 300 MG PO CAPS: 300.0000 mg | ORAL_CAPSULE | Freq: Four times a day (QID) | ORAL | 5 refills | Status: AC

## 2024-04-22 MED ORDER — ALPRAZOLAM 0.5 MG PO TABS: 0.5000 mg | ORAL_TABLET | Freq: Every evening | ORAL | 2 refills | Status: AC | PRN

## 2024-04-25 ENCOUNTER — Telehealth: Payer: Self-pay | Admitting: Behavioral Health

## 2024-04-25 NOTE — Telephone Encounter (Signed)
 Pt last filled alprazolam  1/14 for #90/30 day supply. He reports he needed to take extra some days d/t his wife's condition. He reports he is now taking 3/day, but will be short. Needs RF 2/6. Based on last fill date is not due until 2/11.

## 2024-04-25 NOTE — Telephone Encounter (Signed)
 Pt called in re: RX refill but did not clarify what medication. I tried to call him back. He said that [facility] [address] [facility] hi that his refill would not be available until 2/13 but that he will need it by 2/6.

## 2024-04-26 NOTE — Telephone Encounter (Signed)
 Pt lvm today and said to disregard previous message.  He found his Xanax .  He apologized.

## 2024-04-26 NOTE — Telephone Encounter (Signed)
 Noted.

## 2024-04-26 NOTE — Telephone Encounter (Signed)
 I do not think he has asked for early fill before. His wife had tragic event. Please request from pharm early fill. Make sure Erik Olson understands what the problem is.

## 2024-05-06 ENCOUNTER — Ambulatory Visit: Admitting: Behavioral Health

## 2024-07-20 ENCOUNTER — Ambulatory Visit: Admitting: Behavioral Health
